# Patient Record
Sex: Female | Born: 1965
Health system: Southern US, Community
[De-identification: ages and names within clinical notes are randomized; demographics above are authoritative.]

## PROBLEM LIST (undated history)

## (undated) DIAGNOSIS — F329 Major depressive disorder, single episode, unspecified: Secondary | ICD-10-CM

## (undated) DIAGNOSIS — F419 Anxiety disorder, unspecified: Secondary | ICD-10-CM

## (undated) DIAGNOSIS — R55 Syncope and collapse: Secondary | ICD-10-CM

## (undated) DIAGNOSIS — M810 Age-related osteoporosis without current pathological fracture: Secondary | ICD-10-CM

## (undated) DIAGNOSIS — R002 Palpitations: Secondary | ICD-10-CM

## (undated) DIAGNOSIS — Z803 Family history of malignant neoplasm of breast: Secondary | ICD-10-CM

## (undated) DIAGNOSIS — A64 Unspecified sexually transmitted disease: Secondary | ICD-10-CM

## (undated) HISTORY — DX: Unspecified sexually transmitted disease: A64

## (undated) HISTORY — DX: Anxiety disorder, unspecified: F41.9

## (undated) HISTORY — DX: Family history of malignant neoplasm of breast: Z80.3

## (undated) HISTORY — DX: Age-related osteoporosis without current pathological fracture: M81.0

## (undated) HISTORY — PX: BREAST REDUCTION SURGERY: SHX8

## (undated) HISTORY — DX: Palpitations: R00.2

## (undated) HISTORY — DX: Syncope and collapse: R55

## (undated) HISTORY — DX: Major depressive disorder, single episode, unspecified: F32.9

---

## 1998-02-12 ENCOUNTER — Inpatient Hospital Stay (HOSPITAL_COMMUNITY): Admission: AD | Admit: 1998-02-12 | Discharge: 1998-02-14 | Payer: Self-pay | Admitting: Obstetrics and Gynecology

## 1998-04-06 ENCOUNTER — Other Ambulatory Visit: Admission: RE | Admit: 1998-04-06 | Discharge: 1998-04-06 | Payer: Self-pay | Admitting: Obstetrics and Gynecology

## 1999-05-03 ENCOUNTER — Other Ambulatory Visit: Admission: RE | Admit: 1999-05-03 | Discharge: 1999-05-03 | Payer: Self-pay | Admitting: Obstetrics and Gynecology

## 2000-08-13 ENCOUNTER — Other Ambulatory Visit: Admission: RE | Admit: 2000-08-13 | Discharge: 2000-08-13 | Payer: Self-pay | Admitting: Obstetrics and Gynecology

## 2001-05-21 ENCOUNTER — Encounter: Payer: Self-pay | Admitting: Obstetrics and Gynecology

## 2001-05-21 ENCOUNTER — Ambulatory Visit (HOSPITAL_COMMUNITY): Admission: RE | Admit: 2001-05-21 | Discharge: 2001-05-21 | Payer: Self-pay | Admitting: Obstetrics and Gynecology

## 2001-08-18 ENCOUNTER — Other Ambulatory Visit: Admission: RE | Admit: 2001-08-18 | Discharge: 2001-08-18 | Payer: Self-pay | Admitting: Obstetrics and Gynecology

## 2002-08-11 ENCOUNTER — Other Ambulatory Visit: Admission: RE | Admit: 2002-08-11 | Discharge: 2002-08-11 | Payer: Self-pay | Admitting: Obstetrics and Gynecology

## 2003-04-19 ENCOUNTER — Emergency Department (HOSPITAL_COMMUNITY): Admission: EM | Admit: 2003-04-19 | Discharge: 2003-04-20 | Payer: Self-pay | Admitting: Emergency Medicine

## 2003-08-15 ENCOUNTER — Other Ambulatory Visit: Admission: RE | Admit: 2003-08-15 | Discharge: 2003-08-15 | Payer: Self-pay | Admitting: Obstetrics and Gynecology

## 2004-11-14 DIAGNOSIS — F419 Anxiety disorder, unspecified: Secondary | ICD-10-CM

## 2004-11-14 DIAGNOSIS — F32A Depression, unspecified: Secondary | ICD-10-CM

## 2004-11-14 HISTORY — DX: Depression, unspecified: F32.A

## 2004-11-14 HISTORY — DX: Anxiety disorder, unspecified: F41.9

## 2004-11-19 ENCOUNTER — Other Ambulatory Visit: Admission: RE | Admit: 2004-11-19 | Discharge: 2004-11-19 | Payer: Self-pay | Admitting: Obstetrics and Gynecology

## 2004-11-19 ENCOUNTER — Encounter: Admission: RE | Admit: 2004-11-19 | Discharge: 2004-11-19 | Payer: Self-pay | Admitting: Obstetrics and Gynecology

## 2005-09-04 ENCOUNTER — Other Ambulatory Visit: Admission: RE | Admit: 2005-09-04 | Discharge: 2005-09-04 | Payer: Self-pay | Admitting: Obstetrics and Gynecology

## 2005-11-14 HISTORY — PX: AUGMENTATION MAMMAPLASTY: SUR837

## 2005-11-14 HISTORY — PX: BREAST ENHANCEMENT SURGERY: SHX7

## 2005-11-29 ENCOUNTER — Other Ambulatory Visit: Admission: RE | Admit: 2005-11-29 | Discharge: 2005-11-29 | Payer: Self-pay | Admitting: Obstetrics and Gynecology

## 2006-01-31 ENCOUNTER — Encounter: Admission: RE | Admit: 2006-01-31 | Discharge: 2006-01-31 | Payer: Self-pay | Admitting: Obstetrics and Gynecology

## 2007-02-19 ENCOUNTER — Encounter: Admission: RE | Admit: 2007-02-19 | Discharge: 2007-02-19 | Payer: Self-pay | Admitting: Obstetrics and Gynecology

## 2007-02-24 ENCOUNTER — Other Ambulatory Visit: Admission: RE | Admit: 2007-02-24 | Discharge: 2007-02-24 | Payer: Self-pay | Admitting: Obstetrics and Gynecology

## 2008-03-11 ENCOUNTER — Other Ambulatory Visit: Admission: RE | Admit: 2008-03-11 | Discharge: 2008-03-11 | Payer: Self-pay | Admitting: Obstetrics and Gynecology

## 2008-06-28 ENCOUNTER — Encounter: Admission: RE | Admit: 2008-06-28 | Discharge: 2008-06-28 | Payer: Self-pay | Admitting: Obstetrics and Gynecology

## 2009-01-30 ENCOUNTER — Encounter: Payer: Self-pay | Admitting: Cardiology

## 2009-11-29 ENCOUNTER — Encounter: Admission: RE | Admit: 2009-11-29 | Discharge: 2009-11-29 | Payer: Self-pay | Admitting: Obstetrics and Gynecology

## 2010-12-14 ENCOUNTER — Other Ambulatory Visit: Payer: Self-pay | Admitting: Obstetrics and Gynecology

## 2010-12-14 DIAGNOSIS — Z1231 Encounter for screening mammogram for malignant neoplasm of breast: Secondary | ICD-10-CM

## 2011-01-10 ENCOUNTER — Ambulatory Visit: Payer: Self-pay

## 2011-02-08 ENCOUNTER — Ambulatory Visit
Admission: RE | Admit: 2011-02-08 | Discharge: 2011-02-08 | Disposition: A | Discharge status: Home / Self Care | Payer: BC Managed Care – PPO | Source: Ambulatory Visit | Attending: Obstetrics and Gynecology | Admitting: Obstetrics and Gynecology

## 2011-02-08 DIAGNOSIS — Z1231 Encounter for screening mammogram for malignant neoplasm of breast: Secondary | ICD-10-CM

## 2011-05-29 ENCOUNTER — Encounter: Payer: Self-pay | Admitting: *Deleted

## 2011-05-29 DIAGNOSIS — R002 Palpitations: Secondary | ICD-10-CM | POA: Insufficient documentation

## 2011-05-29 DIAGNOSIS — H538 Other visual disturbances: Secondary | ICD-10-CM | POA: Insufficient documentation

## 2011-05-29 DIAGNOSIS — R55 Syncope and collapse: Secondary | ICD-10-CM

## 2011-09-27 ENCOUNTER — Encounter: Payer: Self-pay | Admitting: Cardiovascular Disease

## 2012-03-25 ENCOUNTER — Encounter: Payer: Self-pay | Admitting: Obstetrics and Gynecology

## 2012-03-26 ENCOUNTER — Encounter: Payer: Self-pay | Admitting: Obstetrics and Gynecology

## 2012-03-31 ENCOUNTER — Ambulatory Visit: Payer: Self-pay | Admitting: Obstetrics and Gynecology

## 2012-04-01 ENCOUNTER — Ambulatory Visit (INDEPENDENT_AMBULATORY_CARE_PROVIDER_SITE_OTHER): Payer: BC Managed Care – PPO | Admitting: Obstetrics and Gynecology

## 2012-04-01 ENCOUNTER — Encounter: Payer: Self-pay | Admitting: Obstetrics and Gynecology

## 2012-04-01 ENCOUNTER — Other Ambulatory Visit: Payer: Self-pay | Admitting: Obstetrics and Gynecology

## 2012-04-01 ENCOUNTER — Other Ambulatory Visit: Payer: Self-pay

## 2012-04-01 VITALS — BP 98/60 | Ht 65.0 in | Wt 151.0 lb

## 2012-04-01 DIAGNOSIS — Z01419 Encounter for gynecological examination (general) (routine) without abnormal findings: Secondary | ICD-10-CM

## 2012-04-01 DIAGNOSIS — Z1231 Encounter for screening mammogram for malignant neoplasm of breast: Secondary | ICD-10-CM

## 2012-04-01 DIAGNOSIS — Z9882 Breast implant status: Secondary | ICD-10-CM

## 2012-04-01 DIAGNOSIS — Z0000 Encounter for general adult medical examination without abnormal findings: Secondary | ICD-10-CM

## 2012-04-01 DIAGNOSIS — B009 Herpesviral infection, unspecified: Secondary | ICD-10-CM

## 2012-04-01 LAB — POCT URINALYSIS DIPSTICK
Blood, UA: NEGATIVE
Ketones, UA: NEGATIVE
Leukocytes, UA: NEGATIVE
Protein, UA: NEGATIVE
pH, UA: 7

## 2012-04-01 MED ORDER — VALACYCLOVIR HCL 500 MG PO TABS: 500.0000 mg | ORAL_TABLET | Freq: Two times a day (BID) | ORAL | Status: DC

## 2012-04-01 NOTE — Patient Instructions (Signed)
We recommended that you start or continue a regular exercise program for good health. Regular exercise means any activity that makes your heart beat faster and makes you sweat.  We recommend exercising at least 30 minutes per day at least 3 days a week, preferably 4 or 5.  We also recommend a diet low in fat and sugar.  Inactivity, poor dietary choices and obesity can cause diabetes, heart attack, stroke, and kidney damage, among others.    All women over 40 years old should have a yearly mammogram. Many facilities now offer a "3D" mammogram, which may cost around $50 extra out of pocket. If possible,  we recommend you accept the option to have the 3D mammogram performed.  It both reduces the number of women who will be called back for extra views which then turn out to be normal, and it is better than the routine mammogram at detecting truly abnormal areas.    Women should limit their alcohol intake to no more than 7 drinks/beers/glasses of wine (combined, not each!) per week. Moderation of alcohol intake to this level decreases your risk of breast cancer and liver damage. And of course, no recreational drugs are part of a healthy lifestyle.  And absolutely no smoking or even second hand smoke. Most people know smoking can cause heart and lung diseases, but did you know it also contributes to weakening of your bones? Aging of your skin?  Yellowing of your teeth and nails?  Pap smears, to check for cervical cancer or precancers,  have traditionally been done yearly, although recent scientific advances have shown that most women can have pap smears less often.  However, every woman still should have a physical exam from her gynecologist every year. It will include a breast check, inspection of the vulva and vagina to check for abnormal growths or skin changes, a visual exam of the cervix, and then an exam to evaluate the size and shape of the uterus and ovaries.  And after 49 years of age, a rectal exam is  indicated to check for rectal cancers. We will also provide age appropriate advice regarding health maintenance, like when you should have certain vaccines, screening for sexually transmitted diseases, bone density testing, colonoscopy, mammograms, etc.   Adequate intake of calcium and Vitamin D are recommended.  The recommendations for exact amounts of these supplements seem to change often, but generally speaking 600 mg of calcium (either carbonate or citrate) and 800 units of Vitamin D per day seems prudent. Certain women may benefit from higher intake of Vitamin D.  If you are among these women, your doctor will have told you during your visit.     

## 2012-04-01 NOTE — Progress Notes (Signed)
47 y.o. MarriedCaucasian female   G5P3 here for annual exam.  Moved into a new house 3 weeks ago.  Pristiq made her feel bad if she forgot it or took it late, so she weaned off it and is doing ok.  [facility] wellnes had her start on armour thyroid last year and a TSH check here was normal, and I instructed her to stop it, so we will re check it today.    No LMP recorded. Patient is not currently having periods (Reason: IUD).          Sexually active: yes  The current method of family planning is IUD.    Exercising: yes  Gym/ health club routine includes cardio. Last mammogram:  1/25/20213   Smoking:no Alcohol:2-3 times a week(wine) Last colonoscopy:none Last Bone Density:  none  Hgb:    14.0            Urine:neg   reports that she quit smoking about 27 years ago. She has never used smokeless tobacco. She reports that she drinks about 1.5 ounces of alcohol per week. She reports that she does not use illicit drugs.  Health Maintenance  Topic Date Due  . Influenza Vaccine  09/14/1964  . Pap Smear  11/14/1981  . Tetanus/tdap  11/15/1982    Family History  Problem Relation Age of Onset  . Coronary artery disease Neg Hx   . Breast cancer Mother 8  . Polycythemia Mother   . Heart attack Mother   . Heart attack Father   . Parkinson's disease Father     Patient Active Problem List  Diagnosis  . Syncope  . Palpitations  . Blurred vision    Past Medical History  Diagnosis Date  . Palpitations   . Syncope and collapse   . Anxiety 11/2004  . Depression 11/2004  . HSV (herpes simplex virus) infection     recurrent HSV lower back    Past Surgical History  Procedure Laterality Date  . Breast reduction surgery    . Breast enhancement surgery  11/2005    saline sub pectorial  . Cesarean section  08/1990    Allergies: Review of patient's allergies indicates no known allergies.  Current Outpatient Prescriptions  Medication Sig Dispense Refill  . levonorgestrel (MIRENA) 20  MCG/24HR IUD 1 each by Intrauterine route once.      . multivitamin (THERAGRAN) per tablet Take 1 tablet by mouth daily.       No current facility-administered medications for this visit.    ROS: Pertinent items are noted in HPI.  Exam:    BP 98/60  Ht 5\' 5"  (1.651 m)  Wt 151 lb (68.493 kg)  BMI 25.13 kg/m2   Wt Readings from Last 3 Encounters:  04/01/12 151 lb (68.493 kg)     Ht Readings from Last 3 Encounters:  04/01/12 5\' 5"  (1.651 m)    General appearance: alert, cooperative and appears stated age Head: Normocephalic, without obvious abnormality, atraumatic Neck: no adenopathy, supple, symmetrical, trachea midline and thyroid not enlarged, symmetric, no tenderness/mass/nodules Lungs: clear to auscultation bilaterally Breasts: Inspection negative, No nipple retraction or dimpling, No nipple discharge or bleeding, No axillary or supraclavicular adenopathy, Normal to palpation without dominant masses Heart: regular rate and rhythm Abdomen: soft, non-tender; bowel sounds normal; no masses,  no organomegaly Extremities: extremities normal, atraumatic, no cyanosis or edema Skin: Skin color, texture, turgor normal. No rashes or lesions Lymph nodes: Cervical, supraclavicular, and axillary nodes normal. No abnormal inguinal nodes palpated Neurologic:  Grossly normal   Pelvic: External genitalia:  no lesions              Urethra:  normal appearing urethra with no masses, tenderness or lesions              Bartholins and Skenes: normal                 Vagina: normal appearing vagina with normal color and discharge, no lesions              Cervix: normal appearance IUD strings vizd              Pap taken: yes        Bimanual Exam:  Uterus:  uterus is normal size, shape, consistency and nontender                                      Adnexa: normal adnexa in size, nontender and no masses                                      Rectovaginal: Confirms                                       Anus:  normal sphincter tone, no lesions  A: well woman     P: mammogram pap smear return annually or prn     An After Visit Summary was printed and given to the patient.

## 2012-04-01 NOTE — Addendum Note (Signed)
Addended by: Alison Murray on: 04/01/2012 05:16 PM   Modules accepted: Orders

## 2012-04-17 ENCOUNTER — Encounter: Payer: Self-pay | Admitting: Obstetrics and Gynecology

## 2012-05-06 ENCOUNTER — Ambulatory Visit
Admission: RE | Admit: 2012-05-06 | Discharge: 2012-05-06 | Disposition: A | Discharge status: Home / Self Care | Payer: BC Managed Care – PPO | Source: Ambulatory Visit

## 2012-05-06 DIAGNOSIS — Z1231 Encounter for screening mammogram for malignant neoplasm of breast: Secondary | ICD-10-CM

## 2012-05-06 DIAGNOSIS — Z9882 Breast implant status: Secondary | ICD-10-CM

## 2012-06-11 ENCOUNTER — Encounter: Payer: Self-pay | Admitting: Obstetrics and Gynecology

## 2012-06-15 ENCOUNTER — Ambulatory Visit: Payer: Self-pay | Admitting: Obstetrics and Gynecology

## 2012-10-14 ENCOUNTER — Telehealth: Payer: Self-pay | Admitting: Obstetrics and Gynecology

## 2012-10-15 ENCOUNTER — Ambulatory Visit (INDEPENDENT_AMBULATORY_CARE_PROVIDER_SITE_OTHER): Payer: BC Managed Care – PPO | Admitting: Obstetrics and Gynecology

## 2012-10-15 ENCOUNTER — Telehealth: Payer: Self-pay | Admitting: Obstetrics and Gynecology

## 2012-10-15 ENCOUNTER — Encounter: Payer: Self-pay | Admitting: Obstetrics and Gynecology

## 2012-10-15 VITALS — BP 100/64 | HR 68 | Ht 65.0 in | Wt 152.5 lb

## 2012-10-15 DIAGNOSIS — F411 Generalized anxiety disorder: Secondary | ICD-10-CM

## 2012-10-15 MED ORDER — FLUOXETINE HCL 10 MG PO TABS: 10.0000 mg | ORAL_TABLET | Freq: Every day | ORAL | Status: DC

## 2012-10-15 MED ORDER — ALPRAZOLAM 0.25 MG PO TABS: 0.2500 mg | ORAL_TABLET | Freq: Three times a day (TID) | ORAL | Status: DC | PRN

## 2012-10-15 NOTE — Telephone Encounter (Addendum)
Pharmacist has a question regarding the quantity of a prescription sent today.  The pharmacist also says the patient normally uses [facility] @[address]  [phone].

## 2012-10-15 NOTE — Progress Notes (Signed)
Patient ID: Stefanie Rodriguez, female   DOB: Jul 18, 1965, 47 y.o.   MRN: [id number] GYNECOLOGY PROBLEM VISIT  PCP: Blair Heys, MD  Referring provider:   HPI: 47 y.o.   Married  Caucasian  female   G5P3 with No LMP recorded. Patient is not currently having periods (Reason: IUD).   here for  Consult.  Anxious.  Was on Celexa, Pristiq, Zoloft, Paxil.  Off all of them.  Felt terrible when went off of them.  Wants to try Prozac.  Weight gain.  Some night sweats.   Some hot flashes.   Decreased sex drive. Vaginal dryness.   Family history of breast cancer - mother at age late 23s and paternal grandfather at age 98.    Patient and husband are flying on separate flights to the [place] due to anxiety about the children.    GYNECOLOGIC HISTORY: No LMP recorded. Patient is not currently having periods (Reason: IUD). Sexually active:  yes Partner preference: female Contraception:  Mirena IUD: inserted 08/2008.  Menopausal hormone therapy: no DES exposure:  no  Blood transfusions:   no Sexually transmitted diseases:   no GYN Procedures:  no Mammogram:  04-01-12 wnl:[facility]               Pap:   04-01-12 wnl History of abnormal pap smear:  no   OB History   Grav Para Term Preterm Abortions TAB SAB Ect Mult Living   5 3        3          Family History  Problem Relation Age of Onset  . Coronary artery disease Neg Hx   . Breast cancer Mother 59  . Polycythemia Mother   . Heart attack Mother   . Heart attack Father   . Parkinson's disease Father     Patient Active Problem List   Diagnosis Date Noted  . Syncope 05/29/2011  . Palpitations 05/29/2011  . Blurred vision 05/29/2011    Past Medical History  Diagnosis Date  . Palpitations   . Syncope and collapse   . Anxiety 11/2004  . Depression 11/2004  . STD (sexually transmitted disease)     Recurrent HSV Lower Back    Past Surgical History  Procedure Laterality Date  . Breast reduction surgery    . Breast  enhancement surgery  11/2005    saline sub pectorial  . Cesarean section  08/1990  . Augmentation mammaplasty  11/07    Saline    ALLERGIES: Review of patient's allergies indicates no known allergies.  Current Outpatient Prescriptions  Medication Sig Dispense Refill  . calcium carbonate (OS-CAL) 600 MG TABS tablet Take 600 mg by mouth 2 (two) times daily with a meal.      . levonorgestrel (MIRENA) 20 MCG/24HR IUD 1 each by Intrauterine route once.      . multivitamin (THERAGRAN) per tablet Take 1 tablet by mouth daily.      . valACYclovir (VALTREX) 500 MG tablet Take 1 tablet (500 mg total) by mouth 2 (two) times daily. Take one tablet BID at onset of symptoms for 3 days.  12 tablet  1   No current facility-administered medications for this visit.     ROS:  Pertinent items are noted in HPI.  SOCIAL HISTORY:    Married.  Three children.   PHYSICAL EXAMINATION:    BP 100/64  Pulse 68  Ht 5\' 5"  (1.651 m)  Wt 152 lb 8 oz (69.174 kg)  BMI 25.38 kg/m2  Wt Readings from Last 3 Encounters:  10/15/12 152 lb 8 oz (69.174 kg)  04/01/12 151 lb (68.493 kg)     Ht Readings from Last 3 Encounters:  10/15/12 5\' 5"  (1.651 m)  04/01/12 5\' 5"  (1.651 m)    General appearance: alert, cooperative and appears stated age  ASSESSMENT  Perimenopausal female. Mirena IUD user. Chronic anxiety. Family history of breast cancer.   PLAN  Start Prozac 10 mg daily.  Medication discussed. See Epic. Limited Rx for Xanax 0.25 mg po tid prn.  Medication discussed.  See Epic.  Referred to Berniece Andreas for counseling.  Patient will call to make an appointment. Will not do estrogen therapy at this time.  Recheck in 2 months.  Annual exam in Spring 2015.  An After Visit Summary was printed and given to the patient.

## 2012-10-15 NOTE — Telephone Encounter (Signed)
Scheduled consult with Dr. Edward Jolly.

## 2012-10-15 NOTE — Patient Instructions (Signed)
Fluoxetine capsules or tablets (Depression/Mood Disorders) What is this medicine? FLUOXETINE (floo OX e teen) belongs to a class of drugs known as selective serotonin reuptake inhibitors (SSRIs). It helps to treat mood problems such as depression, obsessive compulsive disorder, and panic attacks. It can also treat certain eating disorders. This medicine may be used for other purposes; ask your health care provider or pharmacist if you have questions. What should I tell my health care provider before I take this medicine? They need to know if you have any of these conditions: -bipolar disorder or mania -diabetes -glaucoma -liver disease -psychosis -seizures -suicidal thoughts or history of attempted suicide -an unusual or allergic reaction to fluoxetine, other medicines, foods, dyes, or preservatives -pregnant or trying to get pregnant -breast-feeding How should I use this medicine? Take this medicine by mouth with a glass of water. Follow the directions on the prescription label. You can take this medicine with or without food. Take your medicine at regular intervals. Do not take it more often than directed. Do not stop taking this medicine suddenly except upon the advice of your doctor. Stopping this medicine too quickly may cause serious side effects or your condition may worsen. A special MedGuide will be given to you by the pharmacist with each prescription and refill. Be sure to read this information carefully each time. Talk to your pediatrician regarding the use of this medicine in children. While this drug may be prescribed for children as young as 7 years for selected conditions, precautions do apply. Overdosage: If you think you have taken too much of this medicine contact a poison control center or emergency room at once. NOTE: This medicine is only for you. Do not share this medicine with others. What if I miss a dose? If you miss a dose, skip the missed dose and go back to your  regular dosing schedule. Do not take double or extra doses. What may interact with this medicine? Do not take fluoxetine with any of the following medications: -other medicines containing fluoxetine, like Sarafem or Symbyax -cisapride -linezolid -MAOIs like Carbex, Eldepryl, Marplan, Nardil, and Parnate -methylene blue (injected into a vein) -pimozide -thioridazine This medicine may also interact with the following medications: -alcohol -aspirin and aspirin-like medicines -carbamazepine -certain medicines for depression, anxiety, or psychotic disturbances -certain medicines for migraine headaches like almotriptan, eletriptan, frovatriptan, naratriptan, rizatriptan, sumatriptan, zolmitriptan -digoxin -diuretics -fentanyl -flecainide -furazolidone -isoniazid -lithium -medicines for sleep -medicines that treat or prevent blood clots like warfarin, enoxaparin, and dalteparin -NSAIDs, medicines for pain and inflammation, like ibuprofen or naproxen -phenytoin -procarbazine -propafenone -rasagiline -ritonavir -supplements like St. John's wort, kava kava, valerian -tramadol -tryptophan -vinblastine This list may not describe all possible interactions. Give your health care provider a list of all the medicines, herbs, non-prescription drugs, or dietary supplements you use. Also tell them if you smoke, drink alcohol, or use illegal drugs. Some items may interact with your medicine. What should I watch for while using this medicine? Tell your doctor if your symptoms do not get better or if they get worse. Visit your doctor or health care professional for regular checks on your progress. Because it may take several weeks to see the full effects of this medicine, it is important to continue your treatment as prescribed by your doctor. Patients and their families should watch out for new or worsening thoughts of suicide or depression. Also watch out for sudden changes in feelings such as  feeling anxious, agitated, panicky, irritable, hostile, aggressive, impulsive, severely restless,   overly excited and hyperactive, or not being able to sleep. If this happens, especially at the beginning of treatment or after a change in dose, call your health care professional. Bonita Quin may get drowsy or dizzy. Do not drive, use machinery, or do anything that needs mental alertness until you know how this medicine affects you. Do not stand or sit up quickly, especially if you are an older patient. This reduces the risk of dizzy or fainting spells. Alcohol may interfere with the effect of this medicine. Avoid alcoholic drinks. Your mouth may get dry. Chewing sugarless gum or sucking hard candy, and drinking plenty of water may help. Contact your doctor if the problem does not go away or is severe. This medicine may affect blood sugar levels. If you have diabetes, check with your doctor or health care professional before you change your diet or the dose of your diabetic medicine. What side effects may I notice from receiving this medicine? Side effects that you should report to your doctor or health care professional as soon as possible: -allergic reactions like skin rash, itching or hives, swelling of the face, lips, or tongue -breathing problems -confusion -fast or irregular heart rate, palpitations -flu-like fever, chills, cough, muscle or joint aches and pains -seizures -suicidal thoughts or other mood changes -tremors -trouble sleeping -unusual bleeding or bruising -unusually tired or weak -vomiting Side effects that usually do not require medical attention (report to your doctor or health care professional if they continue or are bothersome): -blurred vision -change in sex drive or performance -diarrhea -dry mouth -flushing -headache -increased or decreased appetite -nausea -sweating This list may not describe all possible side effects. Call your doctor for medical advice about side  effects. You may report side effects to FDA at 1-800-FDA-1088. Where should I keep my medicine? Keep out of the reach of children. Store at room temperature between 15 and 30 degrees C (59 and 86 degrees F). Throw away any unused medicine after the expiration date. NOTE: This sheet is a summary. It may not cover all possible information. If you have questions about this medicine, talk to your doctor, pharmacist, or health care provider.  2013, Elsevier/Gold Standard. (05/17/2011 7:31:49 PM) Alprazolam tablets What is this medicine? ALPRAZOLAM (al PRAY zoe lam) is a benzodiazepine. It is used to treat anxiety and panic attacks. This medicine may be used for other purposes; ask your health care provider or pharmacist if you have questions. What should I tell my health care provider before I take this medicine? They need to know if you have any of these conditions: -an alcohol or drug abuse problem -bipolar disorder, depression, psychosis or other mental health conditions -glaucoma -kidney or liver disease -lung or breathing disease -myasthenia gravis -Parkinson's disease -porphyria -seizures or a history of seizures -suicidal thoughts -an unusual or allergic reaction to alprazolam, other benzodiazepines, foods, dyes, or preservatives -pregnant or trying to get pregnant -breast-feeding How should I use this medicine? Take this medicine by mouth with a glass of water. Follow the directions on the prescription label. Take your medicine at regular intervals. Do not take it more often than directed. If you have been taking this medicine regularly for some time, do not suddenly stop taking it. You must gradually reduce the dose or you may get severe side effects. Ask your doctor or health care professional for advice. Even after you stop taking this medicine it can still affect your body for several days. Talk to your pediatrician regarding the use of  this medicine in children. Special care may be  needed. Overdosage: If you think you have taken too much of this medicine contact a poison control center or emergency room at once. NOTE: This medicine is only for you. Do not share this medicine with others. What if I miss a dose? If you miss a dose, take it as soon as you can. If it is almost time for your next dose, take only that dose. Do not take double or extra doses. What may interact with this medicine? Do not take this medicine with any of the following medications: -certain medicines for HIV infection or AIDS -ketoconazole -itraconazole This medicine may also interact with the following medications: -birth control pills -certain macrolide antibiotics like clarithromycin, erythromycin, troleandomycin -cimetidine -cyclosporine -ergotamine -grapefruit juice -herbal or dietary supplements like kava kava, melatonin, dehydroepiandrosterone, DHEA, St. John's Wort or valerian -imatinib, STI-571 -isoniazid -levodopa -medicines for depression, anxiety, or psychotic disturbances -prescription pain medicines -rifampin, rifapentine, or rifabutin -some medicines for blood pressure or heart problems -some medicines for seizures like carbamazepine, oxcarbazepine, phenobarbital, phenytoin, primidone This list may not describe all possible interactions. Give your health care provider a list of all the medicines, herbs, non-prescription drugs, or dietary supplements you use. Also tell them if you smoke, drink alcohol, or use illegal drugs. Some items may interact with your medicine. What should I watch for while using this medicine? Visit your doctor or health care professional for regular checks on your progress. Your body can become dependent on this medicine. Ask your doctor or health care professional if you still need to take it. You may get drowsy or dizzy. Do not drive, use machinery, or do anything that needs mental alertness until you know how this medicine affects you. To reduce the  risk of dizzy and fainting spells, do not stand or sit up quickly, especially if you are an older patient. Alcohol may increase dizziness and drowsiness. Avoid alcoholic drinks. Do not treat yourself for coughs, colds or allergies without asking your doctor or health care professional for advice. Some ingredients can increase possible side effects. What side effects may I notice from receiving this medicine? Side effects that you should report to your doctor or health care professional as soon as possible: -allergic reactions like skin rash, itching or hives, swelling of the face, lips, or tongue -confusion, forgetfulness -depression -difficulty sleeping -difficulty speaking -feeling faint or lightheaded, falls -mood changes, excitability or aggressive behavior -muscle cramps -trouble passing urine or change in the amount of urine -unusually weak or tired Side effects that usually do not require medical attention (report to your doctor or health care professional if they continue or are bothersome): -change in sex drive or performance -changes in appetite This list may not describe all possible side effects. Call your doctor for medical advice about side effects. You may report side effects to FDA at 1-800-FDA-1088. Where should I keep my medicine? Keep out of the reach of children. This medicine can be abused. Keep your medicine in a safe place to protect it from theft. Do not share this medicine with anyone. Selling or giving away this medicine is dangerous and against the law. Store at room temperature between 20 and 25 degrees C (68 and 77 degrees F). Throw away any unused medicine after the expiration date. NOTE: This sheet is a summary. It may not cover all possible information. If you have questions about this medicine, talk to your doctor, pharmacist, or health care provider.  2013, Elsevier/Gold  Standard. (03/26/2007 10:34:46 AM)

## 2012-10-15 NOTE — Telephone Encounter (Signed)
Pharmacy called to clarify Prozac Rx sent as #3 with 1refill, I corrected Rx to say #30 with 1 refill po q day cm

## 2012-10-16 DIAGNOSIS — F411 Generalized anxiety disorder: Secondary | ICD-10-CM | POA: Insufficient documentation

## 2012-12-15 ENCOUNTER — Other Ambulatory Visit: Payer: Self-pay | Admitting: Obstetrics and Gynecology

## 2012-12-15 ENCOUNTER — Other Ambulatory Visit: Payer: Self-pay | Admitting: Orthopedic Surgery

## 2012-12-15 ENCOUNTER — Telehealth: Payer: Self-pay | Admitting: Obstetrics and Gynecology

## 2012-12-15 MED ORDER — FLUOXETINE HCL 10 MG PO TABS: 10.0000 mg | ORAL_TABLET | Freq: Every day | ORAL | Status: DC

## 2012-12-15 NOTE — Telephone Encounter (Signed)
Pt wants to talk with the nurse concerning her medication. Pt is requesting one month of medication until her appt in Jan 2015

## 2012-12-15 NOTE — Telephone Encounter (Signed)
Please let the patient know that I will refill her Rx and see her back in January!  Thanks.

## 2012-12-15 NOTE — Telephone Encounter (Signed)
Spoke with pt who has been taking Prozac 10 mg for a couple of months with good results and no apparent side effects. Pt had to cancel her follow up appt with BS tomorrow and reschedule for next month, and she took her last Prozac today. Pt wondering if she could get a month's supply to get her through to her appt?

## 2012-12-16 ENCOUNTER — Ambulatory Visit: Payer: BC Managed Care – PPO | Admitting: Obstetrics and Gynecology

## 2012-12-17 MED ORDER — FLUOXETINE HCL 10 MG PO TABS: 10.0000 mg | ORAL_TABLET | Freq: Every day | ORAL | Status: DC

## 2012-12-17 NOTE — Telephone Encounter (Signed)
Patient went to pick up medication but they said they do not have it. im showing we called it in 12/15/12 can you check this FLUoxetine (PROZAC) 10 MG tablet  Take 1 tablet (10 mg total) by mouth daily., Starting 12/15/2012, Until Discontinued, No Print, Last Dose: Not Recorded  Refills: 1 ordered Pharmacy: [id number] - [place], [place] - [address]

## 2012-12-17 NOTE — Telephone Encounter (Signed)
Patient is at pharmacy to pick up rx and it is not there. Appears that Rx was printed by Dr. Edward Jolly. [facility] on phone states that patient is there and wants to pick up rx. Rx given. Prozac 10 mg daily #30 with one refill. Repeated x 2.

## 2013-01-15 ENCOUNTER — Ambulatory Visit: Payer: BC Managed Care – PPO | Admitting: Obstetrics and Gynecology

## 2013-01-15 ENCOUNTER — Telehealth: Payer: Self-pay | Admitting: Obstetrics and Gynecology

## 2013-01-15 MED ORDER — FLUOXETINE HCL 10 MG PO TABS: 10.0000 mg | ORAL_TABLET | Freq: Every day | ORAL | Status: DC

## 2013-01-15 NOTE — Telephone Encounter (Signed)
Return call to patient, number confirmation.  LMTCB.  This appointment is for 2 month recheck and this is second time patient has canceled this appointment.

## 2013-01-15 NOTE — Telephone Encounter (Signed)
Pt cancelled her appt for follow up today because she is stuck in [place]. Would like nurse to call and reschedule her appointment.

## 2013-01-15 NOTE — Telephone Encounter (Signed)
Patient choice to reschedule. She returned to Prozac as medication to treat anxiety. If she is having problems on her current medication, she will need an appointment. If she is doing well, does not have to reschedule.

## 2013-01-15 NOTE — Telephone Encounter (Signed)
Patient calls, trveling in [place] with family and decide to extend stay.  Advised there is a cancellation charge for missed appointment but Dr Quincy Simmonds is ok for patient not to reschedule if she feels like symptoms are controlled with current dose.  Patient states she is doing well and requests refill on Prozac 10 mg, only has one pill left. Returning home today so request RX to [facility] at [address]. Denies need for further follow-up. Refill sent.   Routing to provider for final review. Patient agreeable to disposition. Will close encounter

## 2013-04-02 ENCOUNTER — Ambulatory Visit (INDEPENDENT_AMBULATORY_CARE_PROVIDER_SITE_OTHER): Payer: BC Managed Care – PPO | Admitting: Obstetrics and Gynecology

## 2013-04-02 ENCOUNTER — Encounter: Payer: Self-pay | Admitting: Obstetrics and Gynecology

## 2013-04-02 ENCOUNTER — Ambulatory Visit: Payer: BC Managed Care – PPO | Admitting: Obstetrics and Gynecology

## 2013-04-02 VITALS — BP 100/64 | HR 68 | Resp 16 | Ht 65.0 in | Wt 156.5 lb

## 2013-04-02 DIAGNOSIS — R635 Abnormal weight gain: Secondary | ICD-10-CM

## 2013-04-02 DIAGNOSIS — Z01419 Encounter for gynecological examination (general) (routine) without abnormal findings: Secondary | ICD-10-CM

## 2013-04-02 DIAGNOSIS — Z0000 Encounter for general adult medical examination without abnormal findings: Secondary | ICD-10-CM

## 2013-04-02 LAB — POCT URINALYSIS DIPSTICK
Bilirubin, UA: NEGATIVE
Glucose, UA: NEGATIVE
Ketones, UA: NEGATIVE
Leukocytes, UA: NEGATIVE
NITRITE UA: NEGATIVE
PROTEIN UA: NEGATIVE
RBC UA: NEGATIVE
UROBILINOGEN UA: NEGATIVE
pH, UA: 5

## 2013-04-02 LAB — CBC
HCT: 37.3 % (ref 36.0–46.0)
Hemoglobin: 13 g/dL (ref 12.0–15.0)
MCH: 32.6 pg (ref 26.0–34.0)
MCHC: 34.9 g/dL (ref 30.0–36.0)
MCV: 93.5 fL (ref 78.0–100.0)
PLATELETS: 305 10*3/uL (ref 150–400)
RBC: 3.99 MIL/uL (ref 3.87–5.11)
RDW: 13.7 % (ref 11.5–15.5)
WBC: 9.7 10*3/uL (ref 4.0–10.5)

## 2013-04-02 LAB — HEMOGLOBIN, FINGERSTICK: Hemoglobin, fingerstick: 13.3 g/dL (ref 12.0–16.0)

## 2013-04-02 MED ORDER — FLUOXETINE HCL 10 MG PO TABS: 10.0000 mg | ORAL_TABLET | Freq: Every day | ORAL | Status: DC

## 2013-04-02 NOTE — Patient Instructions (Signed)

## 2013-04-02 NOTE — Progress Notes (Signed)
Patient ID: Stefanie Rodriguez, female   DOB: 01/16/1965, 48 y.o.   MRN: [id number]  GYNECOLOGY VISIT  PCP:   Gaynelle Arabian, MD  Referring provider:   HPI: 48 y.o.   Married  Caucasian  female   G5P3 with No LMP recorded. Patient is not currently having periods (Reason: IUD).   here for  AEX.  Having hot flashes and night sweats. No cycles with Mirena IUD. Weight gain.  Wants thyroid check.  On Prozac for anxiety.  Feels balanced.  Wants to continue.   Hgb:    13.3 Urine:   Neg  GYNECOLOGIC HISTORY: No LMP recorded. Patient is not currently having periods (Reason: IUD). Sexually active:  yes Partner preference: female Contraception: Mirena IUD - placed 09/13/08. Menopausal hormone therapy: n/a DES exposure: no   Blood transfusions:   no Sexually transmitted diseases:  no GYN procedures and prior surgeries:  C-section, breast reduction and breast implants. Last mammogram: 05-06-12 [facility]                Last pap and high risk HPV testing:   04-01-12 wnl:neg HR HPV History of abnormal pap smear:  no   OB History   Grav Para Term Preterm Abortions TAB SAB Ect Mult Living   5 3        3        LIFESTYLE: Exercise:  cardio             Tobacco: no Alcohol:   3 glasses of wine per week Drug use:  no  OTHER HEALTH MAINTENANCE: Tetanus/TDap:  02/2008 Gardisil:              n/a Influenza:            2013 Zostavax:            n/a  Bone density:     n/a Colonoscopy:     n/a  Cholesterol check:   2014 wnl   Family History  Problem Relation Age of Onset  . Coronary artery disease Neg Hx   . Breast cancer Mother 92  . Polycythemia Mother   . Heart attack Mother   . Heart attack Father   . Parkinson's disease Father   . Hypertension Father   . Hyperlipidemia Father   . Breast cancer Paternal Grandmother     Patient Active Problem List   Diagnosis Date Noted  . Anxiety state, unspecified 10/16/2012  . Syncope 05/29/2011  . Palpitations 05/29/2011  .  Blurred vision 05/29/2011   Past Medical History  Diagnosis Date  . Palpitations   . Syncope and collapse   . Anxiety 11/2004  . Depression 11/2004  . STD (sexually transmitted disease)     Recurrent HSV Lower Back    Past Surgical History  Procedure Laterality Date  . Breast reduction surgery    . Breast enhancement surgery  11/2005    saline sub pectorial  . Cesarean section  08/1990  . Augmentation mammaplasty  11/07    Saline    ALLERGIES: Review of patient's allergies indicates no known allergies.  Current Outpatient Prescriptions  Medication Sig Dispense Refill  . calcium carbonate (OS-CAL) 600 MG TABS tablet Take 600 mg by mouth 2 (two) times daily with a meal.      . FLUoxetine (PROZAC) 10 MG tablet Take 1 tablet (10 mg total) by mouth daily.  30 tablet  2  . levonorgestrel (MIRENA) 20 MCG/24HR IUD 1 each by Intrauterine route once.      Marland Kitchen  multivitamin (THERAGRAN) per tablet Take 1 tablet by mouth daily.      . valACYclovir (VALTREX) 500 MG tablet Take 1 tablet (500 mg total) by mouth 2 (two) times daily. Take one tablet BID at onset of symptoms for 3 days.  12 tablet  1   No current facility-administered medications for this visit.     ROS:  Pertinent items are noted in HPI.  SOCIAL HISTORY:  At home.  Will go to [place] for brother's wedding in August.   PHYSICAL EXAMINATION:    BP 100/64  Pulse 68  Resp 16  Ht 5\' 5"  (1.651 m)  Wt 156 lb 8 oz (70.988 kg)  BMI 26.04 kg/m2   Wt Readings from Last 3 Encounters:  04/02/13 156 lb 8 oz (70.988 kg)  10/15/12 152 lb 8 oz (69.174 kg)  04/01/12 151 lb (68.493 kg)     Ht Readings from Last 3 Encounters:  04/02/13 5\' 5"  (1.651 m)  10/15/12 5\' 5"  (1.651 m)  04/01/12 5\' 5"  (1.651 m)    General appearance: alert, cooperative and appears stated age Head: Normocephalic, without obvious abnormality, atraumatic Neck: no adenopathy, supple, symmetrical, trachea midline and thyroid not enlarged, symmetric, no  tenderness/mass/nodules Lungs: clear to auscultation bilaterally Breasts: Consistent with reduction and implants.  No nipple retraction or dimpling, No nipple discharge or bleeding, No axillary or supraclavicular adenopathy, Normal to palpation without dominant masses Heart: regular rate and rhythm Abdomen: soft, non-tender; no masses,  no organomegaly Extremities: extremities normal, atraumatic, no cyanosis or edema Skin: Skin color, texture, turgor normal. No rashes or lesions Lymph nodes: Cervical, supraclavicular, and axillary nodes normal. No abnormal inguinal nodes palpated Neurologic: Grossly normal  Pelvic: External genitalia:  no lesions              Urethra:  normal appearing urethra with no masses, tenderness or lesions              Bartholins and Skenes: normal                 Vagina: normal appearing vagina with normal color and discharge, no lesions              Cervix: normal appearance.  IUD strings palpable.               Pap and high risk HPV testing done: no.            Bimanual Exam:  Uterus:  uterus is normal size, shape, consistency and nontender                                      Adnexa: normal adnexa in size, nontender and no masses                                      Rectovaginal: Confirms                                      Anus:  normal sphincter tone, no lesions  ASSESSMENT  Normal gynecologic exam. Perimenopausal symptoms.  Family history of breast cancer.  Anxiety controlled on Prozac. Mirena IUD patient.  Weight gain.   PLAN  Mammogram recommended yearly.   [facility].  Pap smear and high risk HPV testing Counseled on self breast exam, exercise. Lipid profile, CMC, CMP, TSH. Discussed colonoscopy at age 26.  Refill Prozac 10 mg daily.  See EPIC. Will remove Mirena IUD end of August/beginning of September.  Patient may want to see how she does without it and not plan to replace it automatically.  Return  annually or prn   An After Visit Summary was printed and given to the patient.

## 2013-04-03 LAB — LIPID PANEL
CHOL/HDL RATIO: 3.2 ratio
Cholesterol: 246 mg/dL — ABNORMAL HIGH (ref 0–200)
HDL: 76 mg/dL (ref >39)
LDL CALC: 133 mg/dL — AB (ref 0–99)
Triglycerides: 187 mg/dL — ABNORMAL HIGH (ref <150)
VLDL: 37 mg/dL (ref 0–40)

## 2013-04-03 LAB — COMPREHENSIVE METABOLIC PANEL
ALBUMIN: 4.3 g/dL (ref 3.5–5.2)
ALT: 22 U/L (ref 0–35)
AST: 24 U/L (ref 0–37)
Alkaline Phosphatase: 84 U/L (ref 39–117)
BUN: 18 mg/dL (ref 6–23)
CALCIUM: 9.1 mg/dL (ref 8.4–10.5)
CHLORIDE: 101 meq/L (ref 96–112)
CO2: 26 mEq/L (ref 19–32)
Creat: 0.65 mg/dL (ref 0.50–1.10)
Glucose, Bld: 82 mg/dL (ref 70–99)
POTASSIUM: 4.2 meq/L (ref 3.5–5.3)
SODIUM: 138 meq/L (ref 135–145)
TOTAL PROTEIN: 6.5 g/dL (ref 6.0–8.3)
Total Bilirubin: 0.4 mg/dL (ref 0.2–1.2)

## 2013-04-03 LAB — TSH: TSH: 0.86 u[IU]/mL (ref 0.350–4.500)

## 2013-06-24 ENCOUNTER — Other Ambulatory Visit: Payer: Self-pay

## 2013-06-24 DIAGNOSIS — Z1231 Encounter for screening mammogram for malignant neoplasm of breast: Secondary | ICD-10-CM

## 2013-08-13 ENCOUNTER — Ambulatory Visit
Admission: RE | Admit: 2013-08-13 | Discharge: 2013-08-13 | Disposition: A | Discharge status: Home / Self Care | Payer: BC Managed Care – PPO | Source: Ambulatory Visit

## 2013-08-13 DIAGNOSIS — Z1231 Encounter for screening mammogram for malignant neoplasm of breast: Secondary | ICD-10-CM

## 2013-11-15 ENCOUNTER — Encounter: Payer: Self-pay | Admitting: Obstetrics and Gynecology

## 2014-04-07 ENCOUNTER — Ambulatory Visit: Payer: BC Managed Care – PPO | Admitting: Obstetrics and Gynecology

## 2014-04-11 ENCOUNTER — Ambulatory Visit: Payer: BC Managed Care – PPO | Admitting: Obstetrics and Gynecology

## 2014-04-20 ENCOUNTER — Telehealth: Payer: Self-pay | Admitting: Obstetrics and Gynecology

## 2014-04-20 MED ORDER — ALPRAZOLAM 0.25 MG PO TABS: 0.2500 mg | ORAL_TABLET | Freq: Every evening | ORAL | Status: DC | PRN

## 2014-04-20 NOTE — Telephone Encounter (Signed)
Ok for Xanax 0.25 mg po tid prn.  #30, RF zero.

## 2014-04-20 NOTE — Telephone Encounter (Signed)
Spoke with patient. Patient would like to schedule IUD removal at this time. Mirena was due for removal in August of 2015. Removal scheduled for 4/27 at 3:15pm. Patient is agreeable to date and time. Patient also inquiring about medication for traveling. "I am flying to [place] to see my daughter who is studying abroad and I am terrified of flying. I was hoping she could prescribe me a small amount of xanax just to get through the trip. I have not used it since she last gave it to me and it really helped." Advised will speak with provider regarding rx and return call. Patient is agreeable.

## 2014-04-20 NOTE — Telephone Encounter (Signed)
Rx for Xanax 0.25mg  #30 0RF to your desk for signature before faxing.

## 2014-04-20 NOTE — Telephone Encounter (Signed)
Rx faxed to [facility] off [address] with cover sheet and confirmation. Patient notified.  Routing to provider for final review. Patient agreeable to disposition. Will close encounter

## 2014-04-20 NOTE — Telephone Encounter (Signed)
Patient calling stating she needs her IUD remove. She is not sure whether she wants to have a new one inserted.   She also wants to request something for her "nerves" for a trip she has coming up. Pharmacy on file is correct.

## 2014-05-06 ENCOUNTER — Ambulatory Visit (INDEPENDENT_AMBULATORY_CARE_PROVIDER_SITE_OTHER): Payer: BLUE CROSS/BLUE SHIELD | Admitting: Obstetrics and Gynecology

## 2014-05-06 ENCOUNTER — Encounter: Payer: Self-pay | Admitting: Obstetrics and Gynecology

## 2014-05-06 VITALS — BP 120/82 | HR 76 | Resp 22 | Ht 65.0 in | Wt 164.6 lb

## 2014-05-06 DIAGNOSIS — Z30432 Encounter for removal of intrauterine contraceptive device: Secondary | ICD-10-CM | POA: Diagnosis not present

## 2014-05-06 DIAGNOSIS — Z0000 Encounter for general adult medical examination without abnormal findings: Secondary | ICD-10-CM | POA: Diagnosis not present

## 2014-05-06 DIAGNOSIS — N951 Menopausal and female climacteric states: Secondary | ICD-10-CM

## 2014-05-06 DIAGNOSIS — Z01419 Encounter for gynecological examination (general) (routine) without abnormal findings: Secondary | ICD-10-CM | POA: Diagnosis not present

## 2014-05-06 DIAGNOSIS — Z803 Family history of malignant neoplasm of breast: Secondary | ICD-10-CM | POA: Diagnosis not present

## 2014-05-06 LAB — CBC
HCT: 38.7 % (ref 36.0–46.0)
Hemoglobin: 13.5 g/dL (ref 12.0–15.0)
MCH: 33.7 pg (ref 26.0–34.0)
MCHC: 34.9 g/dL (ref 30.0–36.0)
MCV: 96.5 fL (ref 78.0–100.0)
MPV: 11.4 fL (ref 8.6–12.4)
PLATELETS: 248 10*3/uL (ref 150–400)
RBC: 4.01 MIL/uL (ref 3.87–5.11)
RDW: 13.4 % (ref 11.5–15.5)
WBC: 7.8 10*3/uL (ref 4.0–10.5)

## 2014-05-06 LAB — POCT URINALYSIS DIPSTICK
Bilirubin, UA: NEGATIVE
Blood, UA: NEGATIVE
GLUCOSE UA: NEGATIVE
KETONES UA: NEGATIVE
LEUKOCYTES UA: NEGATIVE
NITRITE UA: NEGATIVE
Protein, UA: NEGATIVE
Urobilinogen, UA: NEGATIVE
pH, UA: 5

## 2014-05-06 LAB — COMPREHENSIVE METABOLIC PANEL
ALBUMIN: 3.9 g/dL (ref 3.5–5.2)
ALT: 24 U/L (ref 0–35)
AST: 24 U/L (ref 0–37)
Alkaline Phosphatase: 105 U/L (ref 39–117)
BUN: 16 mg/dL (ref 6–23)
CALCIUM: 8.9 mg/dL (ref 8.4–10.5)
CHLORIDE: 103 meq/L (ref 96–112)
CO2: 24 meq/L (ref 19–32)
CREATININE: 0.66 mg/dL (ref 0.50–1.10)
Glucose, Bld: 81 mg/dL (ref 70–99)
POTASSIUM: 4.2 meq/L (ref 3.5–5.3)
Sodium: 139 mEq/L (ref 135–145)
TOTAL PROTEIN: 6.3 g/dL (ref 6.0–8.3)
Total Bilirubin: 0.5 mg/dL (ref 0.2–1.2)

## 2014-05-06 LAB — LIPID PANEL
CHOL/HDL RATIO: 3.4 ratio
Cholesterol: 248 mg/dL — ABNORMAL HIGH (ref 0–200)
HDL: 72 mg/dL (ref >=46)
LDL CALC: 154 mg/dL — AB (ref 0–99)
Triglycerides: 109 mg/dL (ref <150)
VLDL: 22 mg/dL (ref 0–40)

## 2014-05-06 LAB — TSH: TSH: 1.063 u[IU]/mL (ref 0.350–4.500)

## 2014-05-06 NOTE — Patient Instructions (Signed)
EXERCISE AND DIET:  We recommended that you start or continue a regular exercise program for good health. Regular exercise means any activity that makes your heart beat faster and makes you sweat.  We recommend exercising at least 30 minutes per day at least 3 days a week, preferably 4 or 5.  We also recommend a diet low in fat and sugar.  Inactivity, poor dietary choices and obesity can cause diabetes, heart attack, stroke, and kidney damage, among others.    ALCOHOL AND SMOKING:  Women should limit their alcohol intake to no more than 7 drinks/beers/glasses of wine (combined, not each!) per week. Moderation of alcohol intake to this level decreases your risk of breast cancer and liver damage. And of course, no recreational drugs are part of a healthy lifestyle.  And absolutely no smoking or even second hand smoke. Most people know smoking can cause heart and lung diseases, but did you know it also contributes to weakening of your bones? Aging of your skin?  Yellowing of your teeth and nails?  CALCIUM AND VITAMIN D:  Adequate intake of calcium and Vitamin D are recommended.  The recommendations for exact amounts of these supplements seem to change often, but generally speaking 600 mg of calcium (either carbonate or citrate) and 800 units of Vitamin D per day seems prudent. Certain women may benefit from higher intake of Vitamin D.  If you are among these women, your doctor will have told you during your visit.    PAP SMEARS:  Pap smears, to check for cervical cancer or precancers,  have traditionally been done yearly, although recent scientific advances have shown that most women can have pap smears less often.  However, every woman still should have a physical exam from her gynecologist every year. It will include a breast check, inspection of the vulva and vagina to check for abnormal growths or skin changes, a visual exam of the cervix, and then an exam to evaluate the size and shape of the uterus and  ovaries.  And after 51 years of age, a rectal exam is indicated to check for rectal cancers. We will also provide age appropriate advice regarding health maintenance, like when you should have certain vaccines, screening for sexually transmitted diseases, bone density testing, colonoscopy, mammograms, etc.   MAMMOGRAMS:  All women over 40 years old should have a yearly mammogram. Many facilities now offer a "3D" mammogram, which may cost around $50 extra out of pocket. If possible,  we recommend you accept the option to have the 3D mammogram performed.  It both reduces the number of women who will be called back for extra views which then turn out to be normal, and it is better than the routine mammogram at detecting truly abnormal areas.    COLONOSCOPY:  Colonoscopy to screen for colon cancer is recommended for all women at age 50.  We know, you hate the idea of the prep.  We agree, BUT, having colon cancer and not knowing it is worse!!  Colon cancer so often starts as a polyp that can be seen and removed at colonscopy, which can quite literally save your life!  And if your first colonoscopy is normal and you have no family history of colon cancer, most women don't have to have it again for 10 years.  Once every ten years, you can do something that may end up saving your life, right?  We will be happy to help you get it scheduled when you are ready.    Be sure to check your insurance coverage so you understand how much it will cost.  It may be covered as a preventative service at no cost, but you should check your particular policy.     Menopause and Herbal Products Menopause is the normal time of life when menstrual periods stop completely. Menopause is complete when you have missed 12 consecutive menstrual periods. It usually occurs between the ages of 33 to 51, with an average age of 9. Very rarely does a woman develop menopause before 51 years old. At menopause, your ovaries stop producing the female  hormones, estrogen and progesterone. This can cause undesirable symptoms and also affect your health. Sometimes the symptoms can occur 4 to 5 years before the menopause begins. There is no relationship between menopause and:  Oral contraceptives.  Number of children you had.  Race.  The age your menstrual periods started (menarche). Heavy smokers and very thin women may develop menopause earlier in life. Estrogen and progesterone hormone treatment is the usual method of treating menopausal symptoms. However, there are women who should not take hormone treatment. This is true of:   Women that have breast or uterine cancer.  Women who prefer not to take hormones because of certain side effects (abnormal uterine bleeding).  Women who are afraid that hormones may cause breast cancer.  Women who have a history of liver disease, heart disease, stroke, or blood clots. For these women, there are other medications that may help treat their menopausal symptoms. These medications are found in plants and botanical products. They can be found in the form of herbs, teas, oils, tinctures, and pills.  CAUSES:  The ovaries stop producing the female hormones estrogen and progesterone.  Other causes include:  Surgery to remove both ovaries.  The ovaries stop functioning for no know reason.  Tumors of the pituitary gland in the brain.  Medical disease that affects the ovaries and hormone production.  Radiation treatment to the abdomen or pelvis.  Chemotherapy that affects the ovaries. PHYTOESTROGENS: Phytoestrogens occur naturally in plants and plant products. They act like estrogen in the body. Herbal medications are made from these plants and botanical steroids. There are 3 types of phytoestrogens:  Isoflavones (genistein and daidzein) are found in soy, garbanzo beans, miso and tofu foods.  Ligins are found in the shell of seeds. They are used to make oils like flaxseed oil. The bacteria in  your intestine act on these foods to produce the estrogen-like hormones.  Coumestans are estrogen-like. Some of the foods they are found in include sunflower seeds and bean sprouts. CONDITIONS AND THEIR POSSIBLE HERBAL TREATMENT:  Hot flashes and night sweats.  Soy, black cohosh and evening primrose.  Irritability, insomnia, depression and memory problems.  Chasteberry, ginseng, and soy.  St. John's wort may be helpful for depression. However, there is a concern of it causing cataracts of the eye and may have bad effects on other medications. St. John's wort should not be taken for long time and without your caregiver's advice.  Loss of libido and vaginal and skin dryness.  Wild yam and soy.  Prevention of coronary heart disease and osteoporosis.  Soy and Isoflavones. Several studies have shown that some women benefit from herbal medications, but most of the studies have not consistently shown that these supplements are much better than placebo. Other forms of treatment to help women with menopausal symptoms include a balanced diet, rest, exercise, vitamin and calcium (with vitamin D) supplements, acupuncture, and group therapy  when necessary. THOSE WHO SHOULD NOT TAKE HERBAL MEDICATIONS INCLUDE:  Women who are planning on getting pregnant unless told by your caregiver.  Women who are breastfeeding unless told by your caregiver.  Women who are taking other prescription medications unless told by your caregiver.  Infants, children, and elderly women unless told by your caregiver. Different herbal medications have different and unmeasured amounts of the herbal ingredients. There are no regulations, quality control, and standardization of the ingredients in herbal medications. Therefore, the amount of the ingredient in the medication may vary from one herb, pill, tea, oil or tincture to another. Many herbal medications can cause serious problems and can even have poisonous effects if  taken too much or too long. If problems develop, the medication should be stopped and recorded by your caregiver. HOME CARE INSTRUCTIONS  Do not take or give children herbal medications without your caregiver's advice.  Let your caregiver know all the medications you are taking. This includes prescription, over-the-counter, eye drops, and creams.  Do not take herbal medications longer or more than recommended.  Tell your caregiver about any side effects from the medication. SEEK MEDICAL CARE IF:  You develop a fever of 102 F (38.9 C), or as directed by your caregiver.  You feel sick to your stomach (nauseous), vomit, or have diarrhea.  You develop a rash.  You develop abdominal pain.  You develop severe headaches.  You start to have vision problems.  You feel dizzy or faint.  You start to feel numbness in any part of your body.  You start shaking (have convulsions). Document Released: 06/19/2007 Document Revised: 12/18/2011 Document Reviewed: 01/16/2010 [facility] Patient Information 2015 [place], [place]. This information is not intended to replace advice given to you by your health care provider. Make sure you discuss any questions you have with your health care provider.

## 2014-05-06 NOTE — Progress Notes (Signed)
Patient ID: Stefanie Rodriguez, female   DOB: 08-Oct-1965, 49 y.o.   MRN: [id number] 49 y.o. G29P3 Married Caucasian female here for annual exam.    Mirena IUD expired 08/2013.  Will have IUD removed next week.   Hot flashes.  Some night sweats.   Patient wants a pap.  Has a friend with cervical cancer.  Wants labs and hormonal testing.   Mother with history of breast cancer in her late 32s.  Paternal grandmother with breast cancer.  No FH of ovarian cancer.  Wants to meet with a genetics counselor about risk of breast cancer.   Just back from [place].  Gained weight.  Not happy about this.   PCP:   Kelton Pillar, MD  No LMP recorded. Patient is not currently having periods (Reason: IUD).          Sexually active: Yes.  female partner  The current method of family planning is none. --has Mirena IUD which expired 08/2013.   Exercising: Yes.    pilates and cardio. Smoker:  no  Health Maintenance: Pap:  04-11-12 wnl:neg HR HPV History of abnormal Pap:  no MMG:  08-16-13 implants--fibroglandular density/nl:[facility]. Colonoscopy:  Will schedule soon with Dr. Henrene Pastor BMD:   n/a  Result  n/a TDaP:  02/2008 Screening Labs:  Hb today:  13.7,  Urine today:  neg   reports that she quit smoking about 29 years ago. She has never used smokeless tobacco. She reports that she drinks about 1.8 oz of alcohol per week. She reports that she does not use illicit drugs.  Past Medical History  Diagnosis Date  . Palpitations   . Syncope and collapse   . Anxiety 11/2004  . Depression 11/2004  . STD (sexually transmitted disease)     Recurrent HSV Lower Back    Past Surgical History  Procedure Laterality Date  . Breast reduction surgery    . Breast enhancement surgery  11/2005    saline sub pectorial  . Cesarean section  08/1990  . Augmentation mammaplasty  11/07    Saline    Current Outpatient Prescriptions  Medication Sig Dispense Refill  . ALPRAZolam (XANAX) 0.25 MG tablet Take 1  tablet (0.25 mg total) by mouth at bedtime as needed for anxiety. 30 tablet 0  . calcium carbonate (OS-CAL) 600 MG TABS tablet Take 600 mg by mouth 2 (two) times daily with a meal.    . levonorgestrel (MIRENA) 20 MCG/24HR IUD 1 each by Intrauterine route once.    . multivitamin (THERAGRAN) per tablet Take 1 tablet by mouth daily.    . valACYclovir (VALTREX) 500 MG tablet Take 1 tablet (500 mg total) by mouth 2 (two) times daily. Take one tablet BID at onset of symptoms for 3 days. 12 tablet 1   No current facility-administered medications for this visit.    Family History  Problem Relation Age of Onset  . Coronary artery disease Neg Hx   . Breast cancer Mother 83  . Polycythemia Mother   . Heart attack Mother   . Heart attack Father   . Parkinson's disease Father   . Hypertension Father   . Hyperlipidemia Father   . Breast cancer Paternal Grandmother     ROS:  Pertinent items are noted in HPI.  Otherwise, a comprehensive ROS was negative.  Exam:   BP 120/82 mmHg  Pulse 76  Resp 22  Ht 5\' 5"  (1.651 m)  Wt 164 lb 9.6 oz (74.662 kg)  BMI 27.39  kg/m2  LMP      General appearance: alert, cooperative and appears stated age Head: Normocephalic, without obvious abnormality, atraumatic Neck: no adenopathy, supple, symmetrical, trachea midline and thyroid normal to inspection and palpation Lungs: clear to auscultation bilaterally Breasts: normal appearance, no masses or tenderness, No nipple retraction or dimpling, No nipple discharge or bleeding, No axillary or supraclavicular adenopathy, Breasts consistent with reduction and implants placed bilaterally. Heart: regular rate and rhythm Abdomen: soft, non-tender; bowel sounds normal; no masses,  no organomegaly Extremities: extremities normal, atraumatic, no cyanosis or edema Skin: Skin color, texture, turgor normal. No rashes or lesions Lymph nodes: Cervical, supraclavicular, and axillary nodes normal. No abnormal inguinal nodes  palpated Neurologic: Grossly normal  Pelvic: External genitalia:  no lesions              Urethra:  normal appearing urethra with no masses, tenderness or lesions              Bartholins and Skenes: normal                 Vagina: normal appearing vagina with normal color and discharge, no lesions              Cervix: no lesions and IUD strings present.               Pap taken: Yes.   Bimanual Exam:  Uterus:  normal size, contour, position, consistency, mobility, non-tender              Adnexa: normal adnexa and no mass, fullness, tenderness              Rectovaginal: Yes.  .  Confirms.              Anus:  normal sphincter tone, no lesions  Chaperone was present for exam.  Assessment:   Well woman visit with normal exam. Mirena IUD expired. Menopausal symptoms. FH of breast cancer.  Plan: Yearly mammogram recommended after age 11.  Recommended self breast exam.  Referral for genetic counseling at [facility].  Pap and HR HPV as above. Discussed regular exercise program including cardiovascular and weight bearing exercise. Labs performed.  Yes.   See orders. Refills given on medications.  No.   Return for IUD removal next week. Follow up annually and prn.   Additional counseling given regarding menopausal symptoms and treatment with herbal options.  Declines HRT.  After visit summary provided.

## 2014-05-07 LAB — FOLLICLE STIMULATING HORMONE: FSH: 58.7 m[IU]/mL

## 2014-05-07 LAB — ESTRADIOL: Estradiol: 42.2 pg/mL

## 2014-05-09 ENCOUNTER — Telehealth: Payer: Self-pay | Admitting: Genetic Counselor

## 2014-05-09 ENCOUNTER — Telehealth: Payer: Self-pay | Admitting: Emergency Medicine

## 2014-05-09 LAB — HEMOGLOBIN, FINGERSTICK: HEMOGLOBIN, FINGERSTICK: 13.7 g/dL (ref 12.0–16.0)

## 2014-05-09 NOTE — Telephone Encounter (Signed)
Patient agreeable to changing appointment time for her IUD removal. Scheduled for 05/11/14 at 0830. Routing to provider for final review. Patient agreeable to disposition. Will close encounter

## 2014-05-09 NOTE — Telephone Encounter (Signed)
Spoke with patient and scheduled gen couseling appt with:  Roma Kayser 05/18/14 10 am  Dx: Family Hx of Breast Cancer Referring: Dr. Alvy Bimler

## 2014-05-11 ENCOUNTER — Encounter: Payer: Self-pay | Admitting: Obstetrics and Gynecology

## 2014-05-11 ENCOUNTER — Ambulatory Visit (INDEPENDENT_AMBULATORY_CARE_PROVIDER_SITE_OTHER): Payer: BLUE CROSS/BLUE SHIELD | Admitting: Obstetrics and Gynecology

## 2014-05-11 ENCOUNTER — Ambulatory Visit: Payer: Self-pay | Admitting: Obstetrics and Gynecology

## 2014-05-11 VITALS — BP 112/62 | HR 66 | Ht 65.0 in | Wt 163.2 lb

## 2014-05-11 DIAGNOSIS — Z30432 Encounter for removal of intrauterine contraceptive device: Secondary | ICD-10-CM

## 2014-05-11 DIAGNOSIS — N951 Menopausal and female climacteric states: Secondary | ICD-10-CM

## 2014-05-11 DIAGNOSIS — E7800 Pure hypercholesterolemia, unspecified: Secondary | ICD-10-CM

## 2014-05-11 LAB — POCT URINE PREGNANCY: Preg Test, Ur: NEGATIVE

## 2014-05-11 LAB — IPS PAP TEST WITH HPV

## 2014-05-11 MED ORDER — NORETHIN-ETH ESTRAD-FE BIPHAS 1 MG-10 MCG / 10 MCG PO TABS: 1.0000 | ORAL_TABLET | Freq: Every day | ORAL | Status: DC

## 2014-05-11 NOTE — Progress Notes (Signed)
Patient ID: Stefanie Rodriguez, female   DOB: 12-25-1965, 49 y.o.   MRN: [id number] GYNECOLOGY  VISIT   HPI: 49 y.o.   Married  Caucasian  female   G5P3 with No LMP recorded. Patient is not currently having periods (Reason: IUD).   here for Mirena IUD removal. FSH 58.7 and E2 42.2. Uses Mirena for birth control and heavy cycles.  Wondering if she should go back on a low dose OCP. Not a smoker.  UPT - negative  GYNECOLOGIC HISTORY: No LMP recorded. Patient is not currently having periods (Reason: IUD). Contraception: Mirena IUD--which expire 08/2013   Menopausal hormone therapy: n/a Last pap:  05-06-14 wnl:neg HR HPV Last mammogram:  08-13-13 saline implants/fibroglandular density/nl:[facility]        OB History    Gravida Para Term Preterm AB TAB SAB Ectopic Multiple Living   5 3        3          Patient Active Problem List   Diagnosis Date Noted  . Anxiety state, unspecified 10/16/2012  . Syncope 05/29/2011  . Palpitations 05/29/2011  . Blurred vision 05/29/2011    Past Medical History  Diagnosis Date  . Palpitations   . Syncope and collapse   . Anxiety 11/2004  . Depression 11/2004  . STD (sexually transmitted disease)     Recurrent HSV Lower Back    Past Surgical History  Procedure Laterality Date  . Breast reduction surgery    . Breast enhancement surgery  11/2005    saline sub pectorial  . Cesarean section  08/1990  . Augmentation mammaplasty  11/07    Saline    Current Outpatient Prescriptions  Medication Sig Dispense Refill  . ALPRAZolam (XANAX) 0.25 MG tablet Take 1 tablet (0.25 mg total) by mouth at bedtime as needed for anxiety. 30 tablet 0  . calcium carbonate (OS-CAL) 600 MG TABS tablet Take 600 mg by mouth 2 (two) times daily with a meal.    . levonorgestrel (MIRENA) 20 MCG/24HR IUD 1 each by Intrauterine route once.    . multivitamin (THERAGRAN) per tablet Take 1 tablet by mouth daily.    . valACYclovir (VALTREX) 500 MG tablet Take 1 tablet  (500 mg total) by mouth 2 (two) times daily. Take one tablet BID at onset of symptoms for 3 days. 12 tablet 1   No current facility-administered medications for this visit.     ALLERGIES: Review of patient's allergies indicates no known allergies.  Family History  Problem Relation Age of Onset  . Coronary artery disease Neg Hx   . Breast cancer Mother 55  . Polycythemia Mother   . Heart attack Mother   . Heart attack Father   . Parkinson's disease Father   . Hypertension Father   . Hyperlipidemia Father   . Breast cancer Paternal Grandmother     History   Social History  . Marital Status: Married    Spouse Name: N/A  . Number of Children: N/A  . Years of Education: N/A   Occupational History  . Not on file.   Social History Main Topics  . Smoking status: Former Smoker    Quit date: 01/14/1985  . Smokeless tobacco: Never Used  . Alcohol Use: 1.8 oz/week    3 Standard drinks or equivalent per week     Comment: 3 glasses of wine a week  . Drug Use: No  . Sexual Activity:    Partners: Male    Birth Control/ Protection:  IUD, None     Comment: mirena inserted 09/13/08--EXPIRED   Other Topics Concern  . Not on file   Social History Narrative    ROS:  Pertinent items are noted in HPI.  PHYSICAL EXAMINATION:    BP 112/62 mmHg  Pulse 66  Ht 5\' 5"  (1.651 m)  Wt 163 lb 3.2 oz (74.027 kg)  BMI 27.16 kg/m2     General appearance: alert, cooperative and appears stated age Lungs: clear to auscultation bilaterally Heart: regular rate and rhythm Abdomen: soft, non-tender; no masses,  no organomegaly No abnormal inguinal nodes palpated  Pelvic: External genitalia:  no lesions              Urethra:  normal appearing urethra with no masses, tenderness or lesions              Bartholins and Skenes: normal                 Vagina: normal appearing vagina with normal color and discharge, no lesions              Cervix: normal appearance            IUD Removal Consent  for procedure.  IUD stings grasped and removed with ring forceps.  IUD intact and discarded.  ASSESSMENT  Expired Mirena IUD.  Perimenopausal female.  Menopausal symptoms.  Elevated LDL cholesterol.  FH of CAD.   PLAN  Start LoLoestrin OCPs.  Risks of DVT, PE, MI, stroke discussed.   Will follow up with PCP regarding elevated cholesterol. Discussed proper diet.  Follow up in 3 months for a recheck.    An After Visit Summary was printed and given to the patient.  _15_____ minutes face to face time of which over 50% was spent in counseling.

## 2014-05-18 ENCOUNTER — Ambulatory Visit (HOSPITAL_BASED_OUTPATIENT_CLINIC_OR_DEPARTMENT_OTHER): Payer: BLUE CROSS/BLUE SHIELD | Admitting: Genetic Counselor

## 2014-05-18 ENCOUNTER — Other Ambulatory Visit: Payer: Self-pay

## 2014-05-18 ENCOUNTER — Encounter: Payer: Self-pay | Admitting: Genetic Counselor

## 2014-05-18 DIAGNOSIS — Z803 Family history of malignant neoplasm of breast: Secondary | ICD-10-CM

## 2014-05-18 NOTE — Progress Notes (Signed)
REFERRING PROVIDER: Kelton Pillar, MD [address], [place] [address]   Josefa Half, MD  PRIMARY PROVIDER:  Osborne Casco, MD  PRIMARY REASON FOR VISIT:  1. Family history of breast cancer      HISTORY OF PRESENT ILLNESS:   Ms. Mcwethy, a 49 y.o. female, was seen for a  cancer genetics consultation at the request of Dr. Quincy Simmonds due to a family history of cancer.  Ms. Perl presents to clinic today to discuss the possibility of a hereditary predisposition to cancer, genetic testing, and to further clarify her future cancer risks, as well as potential cancer risks for family members. Ms. Lonzo has no personal history of cancer.  Based on her family history she was concerned whether she may be at risk for a hereditary cancer syndrome.  CANCER HISTORY:   No history exists.     HORMONAL RISK FACTORS:  Menarche was at age 26-13.  First live birth at age 74.  OCP use for approximately 6 years.  Ovaries intact: yes.  Hysterectomy: no.  Menopausal status: perimenopausal.  HRT use: 0 years. Colonoscopy: no; not examined. Mammogram within the last year: yes. Number of breast biopsies: 0. Up to date with pelvic exams:  yes. Any excessive radiation exposure in the past:  no  Past Medical History  Diagnosis Date  . Palpitations   . Syncope and collapse   . Anxiety 11/2004  . Depression 11/2004  . STD (sexually transmitted disease)     Recurrent HSV Lower Back  . Family history of breast cancer     Past Surgical History  Procedure Laterality Date  . Breast reduction surgery    . Breast enhancement surgery  11/2005    saline sub pectorial  . Cesarean section  08/1990  . Augmentation mammaplasty  11/07    Saline    History   Social History  . Marital Status: Married    Spouse Name: N/A  . Number of Children: N/A  . Years of Education: N/A   Social History Main Topics  . Smoking status: Former Smoker -- 2 years    Types: Cigarettes     Quit date: 01/14/1985  . Smokeless tobacco: Never Used  . Alcohol Use: 1.8 oz/week    3 Standard drinks or equivalent per week     Comment: 3 glasses of wine a week  . Drug Use: No  . Sexual Activity:    Partners: Male    Patent examiner Protection: IUD, None     Comment: mirena inserted 09/13/08--EXPIRED   Other Topics Concern  . None   Social History Narrative     FAMILY HISTORY:  We obtained a detailed, 4-generation family history.  Significant diagnoses are listed below: Family History  Problem Relation Age of Onset  . Coronary artery disease Neg Hx   . Breast cancer Mother 60  . Polycythemia Mother   . Heart attack Mother   . Heart attack Father   . Parkinson's disease Father   . Hypertension Father   . Hyperlipidemia Father   . Breast cancer Paternal Grandmother 39  . Cancer Maternal Uncle     NOS  . Bone cancer Maternal Grandmother 60  . Heart attack Maternal Grandfather   . Heart attack Paternal Grandfather    The patient has one son and two daughters who are healthy and cancer free.  She has a sister and brother who have never been diagnosed with cancer.  Her mother had breast cancer  at age 29 which was treated with lumpectomy and radiation.  She is now 15.  Her mother had one brother who died of a NOS cancer.  He had 5 children who are cancer free.  Ms. Sarkis' maternal grandmother had bone cancer, which she thinks was a primary cancer, and her grandmother's sister had some form of cancer.  Ms. Terance Ice father died of Parkinson's disease at age 35.  He has one sister who is alive and cancer free.  His mother had breast cancer at age 33 and his father died of a heart attack. Patient's maternal ancestors are of Caucasian descent, and paternal ancestors are of English descent. There is no reported Ashkenazi Jewish ancestry. There is no known consanguinity.  GENETIC COUNSELING ASSESSMENT: KEALIE BARRIE is a 49 y.o. female with a family history of cancer which somewhat  suggestive of a familial vs sporadic disease, but not hereditary, and a predisposition to cancer. We, therefore, discussed and recommended the following at today's visit.   DISCUSSION: We discussed with Ms. Rail that the family history is not highly consistent with a familial hereditary cancer syndrome, and we feel she is at low risk to harbor a gene mutation associated with such a condition. Thus, we did not recommend any genetic testing, at this time, and recommended Ms. Zemaitis continue to follow the cancer screening guidelines given by her primary healthcare provider.  In order to estimate her chance of having a BRCA mutation, we used statistical models (Penn II, Myriad Risk Calculator and [employer]) and laboratory data that take into account her personal medical history, family history and ancestry.  Because each model is different, there can be a lot of variability in the risks they give.  Therefore, these numbers must be considered a rough range and not a precise risk of having a BRCA mutation.  These models estimate that she has approximately a 0.50-2.0% chance of having a mutation. Based on this assessment of her family and personal history, genetic testing is not recommended.  Based on the patient's personal and family history, statistical models (Tyrer Cusik and [place])  and literature data were used to estimate her risk of developing Breast cancer. These estimate her lifetime risk of developing breast cancer to be approximately 16.8% to 24%. This estimation does not take into account any genetic testing results.  The patient's lifetime breast cancer risk is a preliminary estimate based on available information using one of several models endorsed by the [place] (ACS). The ACS recommends consideration of breast MRI screening as an adjunct to mammography for patients at high risk (defined as 20% or greater lifetime risk). A more detailed breast cancer risk assessment can be considered,  if clinically indicated.   PLAN: Based on the breast cancer calculation, we recommend that Ms. Mccoy' discuss with her providers about the benefits of a breast MRI.  We discussed that her lifetime risk for breast cancer will change over time so this calculation will need to be repeated in the future.   Lastly, we encouraged Ms. Jefferys to remain in contact with cancer genetics annually so that we can continuously update the family history and inform her of any changes in cancer genetics and testing that may be of benefit for this family.   Ms.  Hermida questions were answered to her satisfaction today. Our contact information was provided should additional questions or concerns arise. Thank you for the referral and allowing [place] to share in the care of your patient.  Karen P. Florene Glen, [place], [facility] Certified Genetic Counselor [email or web address] phone: [phone]  The patient was seen for a total of 40 minutes in face-to-face genetic counseling.  This patient was discussed with Drs. Magrinat, Lindi Adie and/or Burr Medico who agrees with the above.    _______________________________________________________________________ For Office Staff:  Number of people involved in session: 1 Was an Intern/ student involved with case: no

## 2014-05-19 ENCOUNTER — Telehealth: Payer: Self-pay

## 2014-05-19 DIAGNOSIS — Z803 Family history of malignant neoplasm of breast: Secondary | ICD-10-CM

## 2014-05-19 NOTE — Telephone Encounter (Signed)
Returning a call to Kaitlyn. °

## 2014-05-19 NOTE — Telephone Encounter (Signed)
Message     Please contact patient to see if she is interested in proceeding forward with a breast MRI due to increased lifetime risk of breast cancer.         She just had genetics counseling at the [facility].     No genetic testing was done based on recommendation of [place].         Mammogram was done in April 2016.         I will authorize the MRI if patient would like to proceed.         Josefa Half, MD        ----- Message -----     From: Clarene Essex, Donia Pounds     Sent: 05/18/2014 11:00 AM      To: Nunzio Cobbs, MD            Left message to call Verline Lema at [phone].

## 2014-05-24 NOTE — Telephone Encounter (Signed)
Left message to call Kaitlyn at [phone]. 

## 2014-05-26 NOTE — Telephone Encounter (Signed)
Spoke with patient. Advised of message as seen below from [place]. Patient would like to proceed with breast MRI at this time. Requesting appointment for Monday, Wednesday, or Friday. Advised will have MRI order placed and schedule appointment. Advised will need to be authorized by insurance which we will do here in the office and give her a call to let her know. Patient is agreeable.

## 2014-05-26 NOTE — Telephone Encounter (Signed)
Spoke with [facility] patient's last mammogram was 08/13/2013. Patient needs mammogram within a month of breast MRI for coverage. Left message to call [place] at [phone]. Has patient had imaging at another location?

## 2014-05-30 NOTE — Telephone Encounter (Signed)
Spoke with patient. Last mammogram was performed on 08/13/2013. Patient has not had further imaging since then. Advised patient MRI will need to be performed within 30 days after recent mammogram for insurance coverage. Patient is agreeable. Would like to proceed with mammogram after next screening which is due in July of this year. Patient is agreeable.  Routing to provider for final review. Patient agreeable to disposition. Will close encounter.   Patient aware provider will review message and nurse will return call if any additional advice or change of disposition.

## 2014-08-15 ENCOUNTER — Telehealth: Payer: Self-pay | Admitting: Obstetrics and Gynecology

## 2014-08-15 NOTE — Telephone Encounter (Signed)
Patient left a message canceling her upcoming recheck appointment 08/17/14. Patient to call later to reschedule.

## 2014-08-16 NOTE — Telephone Encounter (Signed)
FYI--Dr. Quincy Simmonds this was 3 mo. F/u after beginning OCPs.

## 2014-08-16 NOTE — Telephone Encounter (Signed)
Needs a recheck for pill refills.  She has one more month by my calculation.   Encounter closed.

## 2014-08-17 ENCOUNTER — Ambulatory Visit: Payer: BLUE CROSS/BLUE SHIELD | Admitting: Obstetrics and Gynecology

## 2014-08-24 ENCOUNTER — Telehealth: Payer: Self-pay | Admitting: Emergency Medicine

## 2014-08-24 NOTE — Telephone Encounter (Signed)
Patient due for screening mammogram and breast MRI is ordered for her. Calling to offer assistance with scheduling appointments and planning for MRI if patient agreeable.    Message left to return call to [place] at [phone].

## 2014-09-13 ENCOUNTER — Other Ambulatory Visit: Payer: Self-pay

## 2014-09-13 DIAGNOSIS — Z1231 Encounter for screening mammogram for malignant neoplasm of breast: Secondary | ICD-10-CM

## 2014-09-22 ENCOUNTER — Ambulatory Visit
Admission: RE | Admit: 2014-09-22 | Discharge: 2014-09-22 | Disposition: A | Discharge status: Home / Self Care | Payer: BLUE CROSS/BLUE SHIELD | Source: Ambulatory Visit

## 2014-09-22 DIAGNOSIS — Z1231 Encounter for screening mammogram for malignant neoplasm of breast: Secondary | ICD-10-CM

## 2014-09-22 NOTE — Telephone Encounter (Signed)
Message left to return call to Joury Allcorn at [phone].    

## 2014-09-22 NOTE — Telephone Encounter (Signed)
Dr. Quincy Simmonds,  Patient had mammogram today.  Bi-rads 1 Negative.  Okay to contact and discuss MRI of breast planning and also follow up with you?

## 2014-09-22 NOTE — Telephone Encounter (Signed)
Please proceed forward with precerting breast MRI.   Please also schedule a recheck appointment with me.   Patient cancelled her 3 month recheck appointment after starting combined oral contraceptives.   Thanks!

## 2014-09-26 ENCOUNTER — Telehealth: Payer: Self-pay | Admitting: Obstetrics and Gynecology

## 2014-09-26 ENCOUNTER — Other Ambulatory Visit: Payer: Self-pay | Admitting: Obstetrics and Gynecology

## 2014-09-26 DIAGNOSIS — Z803 Family history of malignant neoplasm of breast: Secondary | ICD-10-CM

## 2014-09-26 NOTE — Telephone Encounter (Signed)
Please see encounter dated 08/24/14 that is already opened.  Will close encounter.

## 2014-09-26 NOTE — Telephone Encounter (Signed)
Patient returning Tracy's call. °

## 2014-09-26 NOTE — Telephone Encounter (Signed)
Patient returned call.   She is advised that Mammogram is normal and is ready to proceed with breast MRI. Advised will need breast MRI within 2-3 months after mammogram as comparison is required for MRI.  She is agreeable. Advised [facility] will contact her to schedule.   Patient states she has not started LoLoestrin as she has not started a cycle since IUD was removed on 05/11/14.  05/06/14 FSH was 58.7.   Advised patient will request that Dr. Quincy Simmonds review and advise. Patient is sexually active, feels no risk for pregnancy. Advised will return call with instructions. Patient agreeable.

## 2014-09-28 NOTE — Telephone Encounter (Signed)
Message left to return call to Neomi Laidler at [phone].    

## 2014-09-28 NOTE — Telephone Encounter (Signed)
Dr. Quincy Simmonds,  Patient has not started cycle since IUD removed 05/11/14. Has not started Loloestrin.    MRI scheduled for 10/11/14

## 2014-09-28 NOTE — Telephone Encounter (Signed)
I would have patient do a Provera challenge 10 mg po q day for 10 days.  This will let us know what her endogenous estrogen status is.   Be prepared for a potential period within about 2 weeks of finishing prescription.  This will allow the lining of the uterus to shed if she is not menopausal yet.   Her Dixon was elevated but so was her estradiol level when she did her hormonal testing this year.

## 2014-09-30 MED ORDER — MEDROXYPROGESTERONE ACETATE 10 MG PO TABS: 10.0000 mg | ORAL_TABLET | Freq: Every day | ORAL | Status: DC

## 2014-09-30 NOTE — Telephone Encounter (Signed)
Call to patient and she is given instructions from Dr. Quincy Simmonds.  Will start provera 10 mg po q day for 10 days. She will call back if no cycle within 2 weeks. Advised if starts cycle start Loloestrin.  Patient verbalized understanding of instructions and will call back as needed or if no cycle in two weeks after completing Provera.  Routing to Aleja Yearwood for final review. Patient agreeable to disposition. Will close encounter.

## 2014-10-11 ENCOUNTER — Ambulatory Visit
Admission: RE | Admit: 2014-10-11 | Discharge: 2014-10-11 | Disposition: A | Discharge status: Home / Self Care | Payer: BLUE CROSS/BLUE SHIELD | Source: Ambulatory Visit | Attending: Obstetrics and Gynecology | Admitting: Obstetrics and Gynecology

## 2014-10-11 DIAGNOSIS — Z803 Family history of malignant neoplasm of breast: Secondary | ICD-10-CM

## 2014-10-11 MED ORDER — GADOBENATE DIMEGLUMINE 529 MG/ML IV SOLN
13.0000 mL | Freq: Once | INTRAVENOUS | Status: AC | PRN
  Administered 2014-10-11: 13 mL via INTRAVENOUS

## 2015-05-15 ENCOUNTER — Encounter: Payer: Self-pay | Admitting: Internal Medicine

## 2015-06-16 ENCOUNTER — Ambulatory Visit (INDEPENDENT_AMBULATORY_CARE_PROVIDER_SITE_OTHER): Payer: BLUE CROSS/BLUE SHIELD | Admitting: Obstetrics and Gynecology

## 2015-06-16 ENCOUNTER — Encounter: Payer: Self-pay | Admitting: Obstetrics and Gynecology

## 2015-06-16 VITALS — BP 100/66 | HR 70 | Resp 16 | Ht 65.0 in | Wt 127.4 lb

## 2015-06-16 DIAGNOSIS — Z01419 Encounter for gynecological examination (general) (routine) without abnormal findings: Secondary | ICD-10-CM

## 2015-06-16 DIAGNOSIS — Z113 Encounter for screening for infections with a predominantly sexual mode of transmission: Secondary | ICD-10-CM | POA: Diagnosis not present

## 2015-06-16 DIAGNOSIS — N912 Amenorrhea, unspecified: Secondary | ICD-10-CM | POA: Diagnosis not present

## 2015-06-16 NOTE — Patient Instructions (Signed)
Health Maintenance, Female Adopting a healthy lifestyle and getting preventive care can go a long way to promote health and wellness. Talk with your health care provider about what schedule of regular examinations is right for you. This is a good chance for you to check in with your provider about disease prevention and staying healthy. In between checkups, there are plenty of things you can do on your own. Experts have done a lot of research about which lifestyle changes and preventive measures are most likely to keep you healthy. Ask your health care provider for more information. WEIGHT AND DIET  Eat a healthy diet  Be sure to include plenty of vegetables, fruits, low-fat dairy products, and lean protein.  Do not eat a lot of foods high in solid fats, added sugars, or salt.  Get regular exercise. This is one of the most important things you can do for your health.  Most adults should exercise for at least 150 minutes each week. The exercise should increase your heart rate and make you sweat (moderate-intensity exercise).  Most adults should also do strengthening exercises at least twice a week. This is in addition to the moderate-intensity exercise.  Maintain a healthy weight  Body mass index (BMI) is a measurement that can be used to identify possible weight problems. It estimates body fat based on height and weight. Your health care provider can help determine your BMI and help you achieve or maintain a healthy weight.  For females 20 years of age and older:   A BMI below 18.5 is considered underweight.  A BMI of 18.5 to 24.9 is normal.  A BMI of 25 to 29.9 is considered overweight.  A BMI of 30 and above is considered obese.  Watch levels of cholesterol and blood lipids  You should start having your blood tested for lipids and cholesterol at 52 years of age, then have this test every 5 years.  You may need to have your cholesterol levels checked more often if:  Your lipid  or cholesterol levels are high.  You are older than 52 years of age.  You are at high risk for heart disease.  CANCER SCREENING   Lung Cancer  Lung cancer screening is recommended for adults 55-80 years old who are at high risk for lung cancer because of a history of smoking.  A yearly low-dose CT scan of the lungs is recommended for people who:  Currently smoke.  Have quit within the past 15 years.  Have at least a 30-pack-year history of smoking. A pack year is smoking an average of one pack of cigarettes a day for 1 year.  Yearly screening should continue until it has been 15 years since you quit.  Yearly screening should stop if you develop a health problem that would prevent you from having lung cancer treatment.  Breast Cancer  Practice breast self-awareness. This means understanding how your breasts normally appear and feel.  It also means doing regular breast self-exams. Let your health care provider know about any changes, no matter how small.  If you are in your 20s or 30s, you should have a clinical breast exam (CBE) by a health care provider every 1-3 years as part of a regular health exam.  If you are 40 or older, have a CBE every year. Also consider having a breast X-ray (mammogram) every year.  If you have a family history of breast cancer, talk to your health care provider about genetic screening.  If you   are at high risk for breast cancer, talk to your health care provider about having an MRI and a mammogram every year.  Breast cancer gene (BRCA) assessment is recommended for women who have family members with BRCA-related cancers. BRCA-related cancers include:  Breast.  Ovarian.  Tubal.  Peritoneal cancers.  Results of the assessment will determine the need for genetic counseling and BRCA1 and BRCA2 testing. Cervical Cancer Your health care provider may recommend that you be screened regularly for cancer of the pelvic organs (ovaries, uterus, and  vagina). This screening involves a pelvic examination, including checking for microscopic changes to the surface of your cervix (Pap test). You may be encouraged to have this screening done every 3 years, beginning at age 21.  For women ages 30-65, health care providers may recommend pelvic exams and Pap testing every 3 years, or they may recommend the Pap and pelvic exam, combined with testing for human papilloma virus (HPV), every 5 years. Some types of HPV increase your risk of cervical cancer. Testing for HPV may also be done on women of any age with unclear Pap test results.  Other health care providers may not recommend any screening for nonpregnant women who are considered low risk for pelvic cancer and who do not have symptoms. Ask your health care provider if a screening pelvic exam is right for you.  If you have had past treatment for cervical cancer or a condition that could lead to cancer, you need Pap tests and screening for cancer for at least 20 years after your treatment. If Pap tests have been discontinued, your risk factors (such as having a new sexual partner) need to be reassessed to determine if screening should resume. Some women have medical problems that increase the chance of getting cervical cancer. In these cases, your health care provider may recommend more frequent screening and Pap tests. Colorectal Cancer  This type of cancer can be detected and often prevented.  Routine colorectal cancer screening usually begins at 52 years of age and continues through 52 years of age.  Your health care provider may recommend screening at an earlier age if you have risk factors for colon cancer.  Your health care provider may also recommend using home test kits to check for hidden blood in the stool.  A small camera at the end of a tube can be used to examine your colon directly (sigmoidoscopy or colonoscopy). This is done to check for the earliest forms of colorectal  cancer.  Routine screening usually begins at age 50.  Direct examination of the colon should be repeated every 5-10 years through 52 years of age. However, you may need to be screened more often if early forms of precancerous polyps or small growths are found. Skin Cancer  Check your skin from head to toe regularly.  Tell your health care provider about any new moles or changes in moles, especially if there is a change in a mole's shape or color.  Also tell your health care provider if you have a mole that is larger than the size of a pencil eraser.  Always use sunscreen. Apply sunscreen liberally and repeatedly throughout the day.  Protect yourself by wearing long sleeves, pants, a wide-brimmed hat, and sunglasses whenever you are outside. HEART DISEASE, DIABETES, AND HIGH BLOOD PRESSURE   High blood pressure causes heart disease and increases the risk of stroke. High blood pressure is more likely to develop in:  People who have blood pressure in the high end   of the normal range (130-139/85-89 mm Hg).  People who are overweight or obese.  People who are African American.  If you are 38-23 years of age, have your blood pressure checked every 3-5 years. If you are 61 years of age or older, have your blood pressure checked every year. You should have your blood pressure measured twice--once when you are at a hospital or clinic, and once when you are not at a hospital or clinic. Record the average of the two measurements. To check your blood pressure when you are not at a hospital or clinic, you can use:  An automated blood pressure machine at a pharmacy.  A home blood pressure monitor.  If you are between 45 years and 39 years old, ask your health care provider if you should take aspirin to prevent strokes.  Have regular diabetes screenings. This involves taking a blood sample to check your fasting blood sugar level.  If you are at a normal weight and have a low risk for diabetes,  have this test once every three years after 52 years of age.  If you are overweight and have a high risk for diabetes, consider being tested at a younger age or more often. PREVENTING INFECTION  Hepatitis B  If you have a higher risk for hepatitis B, you should be screened for this virus. You are considered at high risk for hepatitis B if:  You were born in a country where hepatitis B is common. Ask your health care provider which countries are considered high risk.  Your parents were born in a high-risk country, and you have not been immunized against hepatitis B (hepatitis B vaccine).  You have HIV or AIDS.  You use needles to inject street drugs.  You live with someone who has hepatitis B.  You have had sex with someone who has hepatitis B.  You get hemodialysis treatment.  You take certain medicines for conditions, including cancer, organ transplantation, and autoimmune conditions. Hepatitis C  Blood testing is recommended for:  Everyone born from [birth year] through 1967.  Anyone with known risk factors for hepatitis C. Sexually transmitted infections (STIs)  You should be screened for sexually transmitted infections (STIs) including gonorrhea and chlamydia if:  You are sexually active and are younger than 52 years of age.  You are older than 53 years of age and your health care provider tells you that you are at risk for this type of infection.  Your sexual activity has changed since you were last screened and you are at an increased risk for chlamydia or gonorrhea. Ask your health care provider if you are at risk.  If you do not have HIV, but are at risk, it may be recommended that you take a prescription medicine daily to prevent HIV infection. This is called pre-exposure prophylaxis (PrEP). You are considered at risk if:  You are sexually active and do not regularly use condoms or know the HIV status of your partner(s).  You take drugs by injection.  You are sexually  active with a partner who has HIV. Talk with your health care provider about whether you are at high risk of being infected with HIV. If you choose to begin PrEP, you should first be tested for HIV. You should then be tested every 3 months for as long as you are taking PrEP.  PREGNANCY   If you are premenopausal and you may become pregnant, ask your health care provider about preconception counseling.  If you may  become pregnant, take 400 to 800 micrograms (mcg) of folic acid every day.  If you want to prevent pregnancy, talk to your health care provider about birth control (contraception). OSTEOPOROSIS AND MENOPAUSE   Osteoporosis is a disease in which the bones lose minerals and strength with aging. This can result in serious bone fractures. Your risk for osteoporosis can be identified using a bone density scan.  If you are 61 years of age or older, or if you are at risk for osteoporosis and fractures, ask your health care provider if you should be screened.  Ask your health care provider whether you should take a calcium or vitamin D supplement to lower your risk for osteoporosis.  Menopause may have certain physical symptoms and risks.  Hormone replacement therapy may reduce some of these symptoms and risks. Talk to your health care provider about whether hormone replacement therapy is right for you.  HOME CARE INSTRUCTIONS   Schedule regular health, dental, and eye exams.  Stay current with your immunizations.   Do not use any tobacco products including cigarettes, chewing tobacco, or electronic cigarettes.  If you are pregnant, do not drink alcohol.  If you are breastfeeding, limit how much and how often you drink alcohol.  Limit alcohol intake to no more than 1 drink per day for nonpregnant women. One drink equals 12 ounces of beer, 5 ounces of wine, or 1 ounces of hard liquor.  Do not use street drugs.  Do not share needles.  Ask your health care provider for help if  you need support or information about quitting drugs.  Tell your health care provider if you often feel depressed.  Tell your health care provider if you have ever been abused or do not feel safe at home.   This information is not intended to replace advice given to you by your health care provider. Make sure you discuss any questions you have with your health care provider.   Document Released: 07/16/2010 Document Revised: 01/21/2014 Document Reviewed: 12/02/2012 Elsevier Interactive Patient Education [employer].

## 2015-06-16 NOTE — Progress Notes (Signed)
Patient ID: Stefanie Rodriguez, female   DOB: [birth year], 50 y.o.   MRN: [id number] 50 y.o. G53P3 Married Caucasian female here for annual exam.    LMP - April 2016 shortly after Mirena removed.   Lost weight.  Lost almost 40 pounds.  Did dietary change and exercise.  Feeling really good.  Some hot flashes but not extensive.  Recently prescribed Fosamax by per PCP. Has questions about her new diagnosis of osteoporosis.  PCP: Velna Hatchet, MD     Sexually active: Yes.   female The current method of family planning is none.    Exercising: Yes.    cardio and light weights. Smoker:  no  Health Maintenance: Pap:  05-06-14 Neg:Neg HR HPV History of abnormal Pap:  no MMG:  09-22-14 Implants--3D/Density B/Neg/BiRads1:[facility].  Breast MRI Sept 2016 - normal.  Colonoscopy:  Scheduled in July with Dr. Henrene Pastor  BMD:   03/2015  Result  Osteoporosis TDaP:  02/2008 Gardasil:   N/A HIV: Negative in pregnancy.  Hep C:  Today.  Screening Labs:  Hb today: PCP, Urine today: unable to void  reports that she quit smoking about 30 years ago. Her smoking use included Cigarettes. She quit after 2 years of use. She has never used smokeless tobacco. She reports that she drinks about 1.8 oz of alcohol per week. She reports that she does not use illicit drugs.  Past Medical History  Diagnosis Date  . Palpitations   . Syncope and collapse   . Anxiety 11/2004  . Depression 11/2004  . STD (sexually transmitted disease)     Recurrent HSV Lower Back  . Family history of breast cancer     Past Surgical History  Procedure Laterality Date  . Breast reduction surgery    . Breast enhancement surgery  11/2005    saline sub pectorial  . Cesarean section  08/1990  . Augmentation mammaplasty  11/07    Saline    Current Outpatient Prescriptions  Medication Sig Dispense Refill  . ALPRAZolam (XANAX) 0.25 MG tablet Take 1 tablet (0.25 mg total) by mouth at bedtime as needed for anxiety. 30 tablet 0  .  multivitamin (THERAGRAN) per tablet Take 1 tablet by mouth daily.    . Norethindrone-Ethinyl Estradiol-Fe Biphas (LO LOESTRIN FE) 1 MG-10 MCG / 10 MCG tablet Take 1 tablet by mouth daily. 1 Package 3  . valACYclovir (VALTREX) 500 MG tablet Take 1 tablet (500 mg total) by mouth 2 (two) times daily. Take one tablet BID at onset of symptoms for 3 days. 12 tablet 1  . alendronate (FOSAMAX) 70 MG tablet Take 70 mg by mouth once a week. Reported on 06/16/2015  0  . calcium carbonate (OS-CAL) 600 MG TABS tablet Take 600 mg by mouth 2 (two) times daily with a meal. Reported on 06/16/2015    . medroxyPROGESTERone (PROVERA) 10 MG tablet Take 1 tablet (10 mg total) by mouth daily. (Patient not taking: Reported on 06/16/2015) 10 tablet 0   No current facility-administered medications for this visit.    Family History  Problem Relation Age of Onset  . Coronary artery disease Neg Hx   . Breast cancer Mother 90  . Polycythemia Mother   . Heart attack Mother   . Heart attack Father   . Parkinson's disease Father   . Hypertension Father   . Hyperlipidemia Father   . Breast cancer Paternal Grandmother 22  . Cancer Maternal Uncle     NOS  . Bone cancer Maternal  Grandmother 60  . Heart attack Maternal Grandfather   . Heart attack Paternal Grandfather     ROS:  Pertinent items are noted in HPI.  Otherwise, a comprehensive ROS was negative.  Exam:   BP 100/66 mmHg  Pulse 70  Resp 16  Ht 5\' 5"  ([id number] m)  Wt [id number] lb 6.4 oz (57.788 kg)  BMI 21.20 kg/m2    General appearance: alert, cooperative and appears stated age Head: Normocephalic, without obvious abnormality, atraumatic Neck: no adenopathy, supple, symmetrical, trachea midline and thyroid normal to inspection and palpation Lungs: clear to auscultation bilaterally Breasts: No nipple retraction or dimpling, No nipple discharge or bleeding, No axillary or supraclavicular adenopathy, no dominant masses, breast implants present.  Heart: regular rate and  rhythm Abdomen: incisions:  Yes.    Pfannenstiel , soft, non-tender; no masses, no organomegaly Extremities: extremities normal, atraumatic, no cyanosis or edema Skin: Skin color, texture, turgor normal. No rashes or lesions Lymph nodes: Cervical, supraclavicular, and axillary nodes normal. No abnormal inguinal nodes palpated Neurologic: Grossly normal  Pelvic: External genitalia:  no lesions              Urethra:  normal appearing urethra with no masses, tenderness or lesions              Bartholins and Skenes: normal                 Vagina: normal appearing vagina with normal color and discharge, no lesions              Cervix: no lesions              Pap taken:  No. Bimanual Exam:  Uterus:  normal size, contour, position, consistency, mobility, non-tender              Adnexa: normal adnexa and no mass, fullness, tenderness              Rectal exam: Yes.  .  Confirms.              Anus:  normal sphincter tone, no lesions  Chaperone was present for exam.  Assessment:   Well woman visit with normal exam. Amenorrhea.  Probable menopause.  Osteoporosis.  FH of breast cancer.  No genetic testing done after consultation with genetics specialist. Increased lifetime risk of breast cancer. Weight loss.  Plan: Yearly mammogram recommended after age 54. Discussed option for yearly breast MRI. Recommended self breast exam.  Pap and HR HPV as above. Discussed Calcium, Vitamin D, regular exercise program including cardiovascular and weight bearing exercise. Labs performed.  Yes.  .   See orders.  FSH, estradiol, hep C. Prescription medication(s) given.  No.. Patient will return with her bone density and recent lab work with her PCP to discuss her osteoporosis and treatment options further. Follow up annually and prn.       After visit summary provided.

## 2015-06-17 LAB — HEPATITIS C ANTIBODY: HCV AB: NEGATIVE

## 2015-06-17 LAB — ESTRADIOL: ESTRADIOL: 20 pg/mL

## 2015-06-17 LAB — FOLLICLE STIMULATING HORMONE: FSH: 88.5 m[IU]/mL

## 2015-06-20 ENCOUNTER — Telehealth: Payer: Self-pay | Admitting: Obstetrics and Gynecology

## 2015-06-20 NOTE — Telephone Encounter (Signed)
Left patient a message to call back to reschedule a future appointment for a bone density consult that was cancelled by the provider. Patient will call back to reschedule at a later date.

## 2015-06-22 ENCOUNTER — Telehealth: Payer: Self-pay

## 2015-06-22 ENCOUNTER — Ambulatory Visit: Payer: BLUE CROSS/BLUE SHIELD | Admitting: Obstetrics and Gynecology

## 2015-06-22 NOTE — Telephone Encounter (Signed)
Called patient at [phone] to discuss lab results, left message to call me back.  Patient has not reviewed her "MyChart" message.

## 2015-06-22 NOTE — Telephone Encounter (Signed)
-----   Message from Nunzio Cobbs, MD sent at 06/17/2015 12:42 PM EDT ----- Results to patient through My Chart.  "Hi Stefanie Rodriguez,   Your labs are back, and they confirm menopause.   Your hepatitis C testing is negative.   I will see you back for consultation about your new diagnosis of osteoporosis.   Have a great weekend.   Josefa Half, MD" "

## 2015-07-07 NOTE — Telephone Encounter (Signed)
Patient has appointment 07-12-15 to discuss Bone Density results.

## 2015-07-12 ENCOUNTER — Ambulatory Visit (INDEPENDENT_AMBULATORY_CARE_PROVIDER_SITE_OTHER): Payer: BLUE CROSS/BLUE SHIELD | Admitting: Obstetrics and Gynecology

## 2015-07-12 ENCOUNTER — Encounter: Payer: Self-pay | Admitting: Obstetrics and Gynecology

## 2015-07-12 ENCOUNTER — Ambulatory Visit (AMBULATORY_SURGERY_CENTER): Payer: Self-pay | Admitting: *Deleted

## 2015-07-12 VITALS — Ht 65.0 in | Wt 129.0 lb

## 2015-07-12 VITALS — BP 110/66 | HR 70 | Ht 65.0 in | Wt 128.6 lb

## 2015-07-12 DIAGNOSIS — Z1211 Encounter for screening for malignant neoplasm of colon: Secondary | ICD-10-CM

## 2015-07-12 DIAGNOSIS — M810 Age-related osteoporosis without current pathological fracture: Secondary | ICD-10-CM

## 2015-07-12 MED ORDER — NA SULFATE-K SULFATE-MG SULF 17.5-3.13-1.6 GM/177ML PO SOLN: 1.0000 | Freq: Once | ORAL | Status: DC

## 2015-07-12 NOTE — Progress Notes (Signed)
Patient ID: Stefanie Rodriguez, female   DOB: January 05, 1966, 50 y.o.   MRN: [id number] GYNECOLOGY  VISIT   HPI: 50 y.o.   Married  Caucasian  female   G5P3 with No LMP recorded. Patient is not currently having periods (Reason: Perimenopausal).   here for consult to discuss Bone Density results.  BMD report showing: T score of spine: -3.6 T score of left hip: - 2.4 T score of right hip:  -2.7  Rx from PCP for Fosamax. Has not started it yet.   No hx colitis or gastric surgery.  No chronic steroids.  Not a smoker.  No lactose intolerance.  Is minimizing dairy products.   Takes calcium and vit D.   Drinks almond milk   Ca 9.7, TSH 1.22 Cr 0.7  Mother has osteoporosis and had fx of both hips.   GYNECOLOGIC HISTORY: No LMP recorded. Patient is not currently having periods (Reason: Perimenopausal). Contraception:  Postmenopausal Menopausal hormone therapy:  none Last mammogram:  09-22-14 Bil.reduction 1986 and bil.implants 2007--3D/density B/Neg/BiRads1:[facility] Last pap smear:   05-06-14 Neg:Neg HR HPV        OB History    Gravida Para Term Preterm AB TAB SAB Ectopic Multiple Living   _0 Patient Active Problem List   Diagnosis Date Noted  . Family history of breast cancer   . Anxiety state, unspecified 10/16/2012  . Syncope 05/29/2011  . Palpitations 05/29/2011  . Blurred vision 05/29/2011    Past Medical History  Diagnosis Date  . Palpitations   . Syncope and collapse   . Anxiety 11/2004  . Depression 11/2004  . STD (sexually transmitted disease)     Recurrent HSV Lower Back  . Family history of breast cancer     Past Surgical History  Procedure Laterality Date  . Breast reduction surgery    . Breast enhancement surgery  11/2005    saline sub pectorial  . Cesarean section  08/1990  . Augmentation mammaplasty  11/07    Saline    Current Outpatient Prescriptions  Medication Sig Dispense Refill  . alendronate (FOSAMAX) 70 MG tablet  Take 70 mg by mouth once a week. Reported on 07/12/2015  0  . calcium carbonate (OS-CAL) 600 MG TABS tablet Take 600 mg by mouth 2 (two) times daily with a meal. Reported on 06/16/2015    . multivitamin (THERAGRAN) per tablet Take 1 tablet by mouth daily.    . Na Sulfate-K Sulfate-Mg Sulf (SUPREP BOWEL PREP KIT) 17.5-3.13-1.6 GM/180ML SOLN Take 1 kit by mouth once. Name brand only, suprep as directed, no substitutions 354 mL 0  . valACYclovir (VALTREX) 500 MG tablet Take 1 tablet (500 mg total) by mouth 2 (two) times daily. Take one tablet BID at onset of symptoms for 3 days. 12 tablet 1   No current facility-administered medications for this visit.     ALLERGIES: Review of patient's allergies indicates no known allergies.  Family History  Problem Relation Age of Onset  . Coronary artery disease Neg Hx   . Colon cancer Neg Hx   . Breast cancer Mother 53  . Polycythemia Mother   . Heart attack Mother   . Heart attack Father   . Parkinson's disease Father   . Hypertension Father   . Hyperlipidemia Father   . Breast cancer Paternal Grandmother 61  . Cancer Maternal Uncle     NOS  .  Bone cancer Maternal Grandmother 60  . Heart attack Maternal Grandfather   . Heart attack Paternal Grandfather     Social History   Social History  . Marital Status: Married    Spouse Name: N/A  . Number of Children: N/A  . Years of Education: N/A   Occupational History  . Not on file.   Social History Main Topics  . Smoking status: Former Smoker -- 2 years    Types: Cigarettes    Quit date: 01/14/1985  . Smokeless tobacco: Never Used  . Alcohol Use: 1.8 oz/week    3 Standard drinks or equivalent per week     Comment: 3 glasses of wine a week  . Drug Use: No  . Sexual Activity:    Partners: Male    Birth Control/ Protection: None   Other Topics Concern  . Not on file   Social History Narrative    ROS:  Pertinent items are noted in HPI.  PHYSICAL EXAMINATION:    Ht _0  (1.651 m)     General appearance: alert, cooperative and appears stated age   Chaperone was present for exam.  ASSESSMENT  Advanced osteoporosis. Newly menopausal female. FH of breast cancer. No genetic testing done after consultation with genetics specialist. Increased lifetime risk of breast cancer. Weight loss.  PLAN  Discussion of osteoporosis - risk factors, fracture risk. Patient has advanced disease at a young age.  I am recommending a PTH and vitamin D level.  We discussed options for tx including bisphosphonates, Evista, Reclast, Prolia, and Forteo. I am recommending consultation with an endocrinologist for potential further evaluation and tx as patient may need to go directly to a more effective tx for her bone loss such as Reclast, Prolia, or Forteo.  Discussed reducing risk of falls. Follow for annual and prn.    An After Visit Summary was printed and given to the patient.  _25_____ minutes face to face time of which over 50% was spent in counseling.

## 2015-07-12 NOTE — Progress Notes (Signed)
No egg or soy allergy. No anesthesia problems.  No home O2.  No diet meds.  

## 2015-07-13 ENCOUNTER — Other Ambulatory Visit: Payer: Self-pay | Admitting: Obstetrics and Gynecology

## 2015-07-13 DIAGNOSIS — M810 Age-related osteoporosis without current pathological fracture: Secondary | ICD-10-CM

## 2015-07-13 LAB — PARATHYROID HORMONE, INTACT (NO CA): PTH: 31 pg/mL (ref 14–64)

## 2015-07-13 LAB — VITAMIN D 25 HYDROXY (VIT D DEFICIENCY, FRACTURES): Vit D, 25-Hydroxy: 33 ng/mL (ref 30–100)

## 2015-07-18 ENCOUNTER — Encounter: Payer: Self-pay | Admitting: Internal Medicine

## 2015-07-24 ENCOUNTER — Telehealth: Payer: Self-pay | Admitting: Obstetrics and Gynecology

## 2015-07-24 NOTE — Telephone Encounter (Signed)
Left voicemail regarding referral appointment. The information is listed below. Should the patient need to cancel or reschedule this appointment, please advise them to call the office they've been referred to in order to reschedule.  [facility] [address] [place], [place] [address] Phone: [phone]  08/09/15 @ 430 with Dr Chalmers Cater. Please arrive 15 minutes early with insurance card and photo id.

## 2015-07-25 NOTE — Telephone Encounter (Signed)
Patient scheduled with Dr.Balan in the next few weeks, cannot remember exact date. Patient said no need to return her call unless you have questions.

## 2015-08-01 ENCOUNTER — Encounter: Payer: Self-pay | Admitting: Internal Medicine

## 2015-08-01 ENCOUNTER — Ambulatory Visit (AMBULATORY_SURGERY_CENTER): Payer: BLUE CROSS/BLUE SHIELD | Admitting: Internal Medicine

## 2015-08-01 ENCOUNTER — Telehealth: Payer: Self-pay | Admitting: Internal Medicine

## 2015-08-01 VITALS — BP 114/70 | HR 56 | Temp 97.5°F | Resp 17 | Ht 65.0 in | Wt 129.0 lb

## 2015-08-01 DIAGNOSIS — D122 Benign neoplasm of ascending colon: Secondary | ICD-10-CM

## 2015-08-01 DIAGNOSIS — Z1211 Encounter for screening for malignant neoplasm of colon: Secondary | ICD-10-CM

## 2015-08-01 DIAGNOSIS — D125 Benign neoplasm of sigmoid colon: Secondary | ICD-10-CM | POA: Diagnosis not present

## 2015-08-01 MED ORDER — SODIUM CHLORIDE 0.9 % IV SOLN: 500.0000 mL | INTRAVENOUS | Status: DC

## 2015-08-01 NOTE — Op Note (Signed)
[place] Patient Name: Stefanie Rodriguez Procedure Date: 08/01/2015 10:00 AM MRN: [id number] Endoscopist: Docia Chuck. Henrene Pastor , MD Age: 50 Referring MD:  Date of Birth: November 14, 1965 Gender: Female Account #: [id number] Procedure:                Colonoscopy, with cold snare polypectomy x 2 Indications:              Screening for colorectal malignant neoplasm Medicines:                Monitored Anesthesia Care Procedure:                Pre-Anesthesia Assessment:                           - Prior to the procedure, a History and Physical                            was performed, and patient medications and                            allergies were reviewed. The patient's tolerance of                            previous anesthesia was also reviewed. The risks                            and benefits of the procedure and the sedation                            options and risks were discussed with the patient.                            All questions were answered, and informed consent                            was obtained. Prior Anticoagulants: The patient has                            taken no previous anticoagulant or antiplatelet                            agents. ASA Grade Assessment: I - A normal, healthy                            patient. After reviewing the risks and benefits,                            the patient was deemed in satisfactory condition to                            undergo the procedure.                           After obtaining informed consent, the colonoscope  was passed under direct vision. Throughout the                            procedure, the patient's blood pressure, pulse, and                            oxygen saturations were monitored continuously. The                            Model CF-HQ190L [id number]) scope was introduced                            through the anus and advanced to the the cecum,       identified by appendiceal orifice and ileocecal                            valve. The ileocecal valve, appendiceal orifice,                            and rectum were photographed. The quality of the                            bowel preparation was excellent. The colonoscopy                            was performed without difficulty. The patient                            tolerated the procedure well. The bowel preparation                            used was SUPREP. Scope In: 10:13:17 AM Scope Out: 10:31:32 AM Scope Withdrawal Time: 0 hours 14 minutes 50 seconds  Total Procedure Duration: 0 hours 18 minutes 15 seconds  Findings:                 Two polyps were found in the sigmoid colon and                            ascending colon. The polyps were 3 to 6 mm in size.                            These polyps were removed with a cold snare.                            Resection and retrieval were complete.                           The exam was otherwise without abnormality on                            direct and retroflexion views. Complications:            No immediate complications. Estimated blood loss:  None. Estimated Blood Loss:     Estimated blood loss: none. Impression:               - Two 3 to 6 mm polyps in the sigmoid colon and in                            the ascending colon, removed with a cold snare.                            Resected and retrieved.                           - The examination was otherwise normal on direct                            and retroflexion views. Recommendation:           - Repeat colonoscopy in 5 years for surveillance.                           - Patient has a contact number available for                            emergencies. The signs and symptoms of potential                            delayed complications were discussed with the                            patient. Return to normal activities tomorrow.                             Written discharge instructions were provided to the                            patient.                           - Resume previous diet.                           - Continue present medications.                           - Await pathology results. Docia Chuck. Henrene Pastor, MD 08/01/2015 10:35:59 AM This report has been signed electronically. CC Letter to:             [employer]

## 2015-08-01 NOTE — Progress Notes (Signed)
A and O x3. Report to RN. Tolerated MAC anesthesia well. 

## 2015-08-01 NOTE — Progress Notes (Signed)
Called to room to assist during endoscopic procedure.  Patient ID and intended procedure confirmed with present staff. Received instructions for my participation in the procedure from the performing physician.  

## 2015-08-01 NOTE — Patient Instructions (Signed)
YOU HAD AN ENDOSCOPIC PROCEDURE TODAY AT [facility]:   Refer to the procedure report that was given to you for any specific questions about what was found during the examination.  If the procedure report does not answer your questions, please call your gastroenterologist to clarify.  If you requested that your care partner not be given the details of your procedure findings, then the procedure report has been included in a sealed envelope for you to review at your convenience later.  YOU SHOULD EXPECT: Some feelings of bloating in the abdomen. Passage of more gas than usual.  Walking can help get rid of the air that was put into your GI tract during the procedure and reduce the bloating. If you had a lower endoscopy (such as a colonoscopy or flexible sigmoidoscopy) you may notice spotting of blood in your stool or on the toilet paper. If you underwent a bowel prep for your procedure, you may not have a normal bowel movement for a few days.  Please Note:  You might notice some irritation and congestion in your nose or some drainage.  This is from the oxygen used during your procedure.  There is no need for concern and it should clear up in a day or so.  SYMPTOMS TO REPORT IMMEDIATELY:   Following lower endoscopy (colonoscopy or flexible sigmoidoscopy):  Excessive amounts of blood in the stool  Significant tenderness or worsening of abdominal pains  Swelling of the abdomen that is new, acute  Fever of 100F or higher    For urgent or emergent issues, a gastroenterologist can be reached at any hour by calling [phone].   DIET: Your first meal following the procedure should be a small meal and then it is ok to progress to your normal diet. Heavy or fried foods are harder to digest and may make you feel nauseous or bloated.  Likewise, meals heavy in dairy and vegetables can increase bloating.  Drink plenty of fluids but you should avoid alcoholic beverages for 24  hours.  ACTIVITY:  You should plan to take it easy for the rest of today and you should NOT DRIVE or use heavy machinery until tomorrow (because of the sedation medicines used during the test).    FOLLOW UP: Our staff will call the number listed on your records the next business day following your procedure to check on you and address any questions or concerns that you may have regarding the information given to you following your procedure. If we do not reach you, we will leave a message.  However, if you are feeling well and you are not experiencing any problems, there is no need to return our call.  We will assume that you have returned to your regular daily activities without incident.  If any biopsies were taken you will be contacted by phone or by letter within the next 1-3 weeks.  Please call us at [phone] if you have not heard about the biopsies in 3 weeks.    SIGNATURES/CONFIDENTIALITY: You and/or your care partner have signed paperwork which will be entered into your electronic medical record.  These signatures attest to the fact that that the information above on your After Visit Summary has been reviewed and is understood.  Full responsibility of the confidentiality of this discharge information lies with you and/or your care-partner.  Polyp handout provided. Continue present medications.

## 2015-08-01 NOTE — Telephone Encounter (Signed)
Spoke with patient's husband initially, and he stated that the patient had two separate bowel movements within 15 minutes of each other.  Stated that there were were several clots and a lot of blood in the bottom of the bowl.   Then, the patient came on the phone and stated that she had some bleeding with clots.   I told her to go to the emergency room to be checked out.  Then she stated that she was a good friend of Dr. Henrene Pastor, and she'd like to know what hospital he preferred.  I told her to go to [facility] to to it's proximity to her.  She agreed.  She stated that she might call Dr. Henrene Pastor, and see what he thinks.  Dr. Henrene Pastor was paged to this number after speaking with Dr. Loletha Carrow who was the doctor of the day when this call came into the recovery room at 4:50pm.  I spoke with Dr. Henrene Pastor and he will call the patient.

## 2015-08-02 ENCOUNTER — Telehealth: Payer: Self-pay

## 2015-08-02 NOTE — Telephone Encounter (Signed)
Left message on answering machine. 

## 2015-08-03 ENCOUNTER — Encounter: Payer: Self-pay | Admitting: Internal Medicine

## 2015-10-18 ENCOUNTER — Other Ambulatory Visit: Payer: Self-pay | Admitting: Obstetrics and Gynecology

## 2015-10-18 DIAGNOSIS — Z1231 Encounter for screening mammogram for malignant neoplasm of breast: Secondary | ICD-10-CM

## 2015-10-31 ENCOUNTER — Ambulatory Visit
Admission: RE | Admit: 2015-10-31 | Discharge: 2015-10-31 | Disposition: A | Discharge status: Home / Self Care | Payer: BLUE CROSS/BLUE SHIELD | Source: Ambulatory Visit | Attending: Obstetrics and Gynecology | Admitting: Obstetrics and Gynecology

## 2015-10-31 DIAGNOSIS — Z1231 Encounter for screening mammogram for malignant neoplasm of breast: Secondary | ICD-10-CM

## 2015-11-02 ENCOUNTER — Encounter: Payer: Self-pay | Admitting: Certified Nurse Midwife

## 2015-11-02 ENCOUNTER — Ambulatory Visit (INDEPENDENT_AMBULATORY_CARE_PROVIDER_SITE_OTHER): Payer: BLUE CROSS/BLUE SHIELD | Admitting: Certified Nurse Midwife

## 2015-11-02 VITALS — BP 110/62 | HR 70 | Temp 98.1°F | Resp 16 | Ht 65.0 in | Wt 131.0 lb

## 2015-11-02 DIAGNOSIS — R319 Hematuria, unspecified: Secondary | ICD-10-CM

## 2015-11-02 DIAGNOSIS — N390 Urinary tract infection, site not specified: Secondary | ICD-10-CM | POA: Diagnosis not present

## 2015-11-02 LAB — POCT URINALYSIS DIPSTICK
BILIRUBIN UA: NEGATIVE
Glucose, UA: NEGATIVE
Ketones, UA: NEGATIVE
NITRITE UA: NEGATIVE
PH UA: 5
PROTEIN UA: NEGATIVE
Urobilinogen, UA: NEGATIVE

## 2015-11-02 MED ORDER — NITROFURANTOIN MONOHYD MACRO 100 MG PO CAPS: 100.0000 mg | ORAL_CAPSULE | Freq: Two times a day (BID) | ORAL | 0 refills | Status: DC

## 2015-11-02 MED ORDER — PHENAZOPYRIDINE HCL 200 MG PO TABS: 200.0000 mg | ORAL_TABLET | Freq: Three times a day (TID) | ORAL | 0 refills | Status: DC | PRN

## 2015-11-02 NOTE — Progress Notes (Signed)
50 y.o. Married Caucasian female G5P3 here with complaint of UTI, with onset  on in last 24 hours.. Patient complaining of urinary frequency/urgency/ and pain with urination. Patient denies fever, chills, nausea or back pain. No new personal products. Patient feels not related to sexual activity. Denies any vaginal symptoms.  Menopausal with vaginal dryness. Patient is consuming adequate water intake. No other health issues.   O: Healthy female WDWN Affect: Normal, orientation x 3 Skin : warm and dry CVAT: negative bilateral Abdomen: positive for suprapubic tenderness  Pelvic exam: External genital area: normal, no lesions Bladder,Urethra tender, Urethral meatus: tender, red Vagina: normal vaginal discharge, normal appearance  Vaginal dryness noted around introitus Cervix: normal, non tender Uterus:normal,non tender Adnexa: normal non tender, no fullness or masses   A: UTI with hematuria Normal pelvic exam poct urine-wbc 1+, rbc 2+ Vaginal dryness noted  P: Reviewed findings of UTI and need for treatment. [id number] see order with instructions Rx Pyridium see order [id number] micro, culture Reviewed warning signs and symptoms of UTI and need to advise if occurring. Encouraged to limit soda, tea, and coffee and be sure to increase water intake. Discussed vaginal dryness noted around entrance to vagina, which can increase risk of UTI. Discussed OTC coconut oil use for moisture and protection. Patient uses for her hands and happy to use as directed once UTI cleared.   RV prn

## 2015-11-02 NOTE — Patient Instructions (Signed)

## 2015-11-03 LAB — URINALYSIS, MICROSCOPIC ONLY
CASTS: NONE SEEN [LPF]
CRYSTALS: NONE SEEN [HPF]
Squamous Epithelial / LPF: NONE SEEN [HPF] (ref <=5)
YEAST: NONE SEEN [HPF]

## 2015-11-04 LAB — URINE CULTURE: Colony Count: >=100000

## 2015-11-10 NOTE — Progress Notes (Signed)
Encounter reviewed Jill Jertson, MD   

## 2016-06-19 ENCOUNTER — Ambulatory Visit: Payer: BLUE CROSS/BLUE SHIELD | Admitting: Obstetrics and Gynecology

## 2016-08-19 ENCOUNTER — Ambulatory Visit: Payer: BLUE CROSS/BLUE SHIELD | Admitting: Obstetrics and Gynecology

## 2016-11-11 ENCOUNTER — Ambulatory Visit: Payer: BLUE CROSS/BLUE SHIELD | Admitting: Obstetrics and Gynecology

## 2017-07-28 ENCOUNTER — Other Ambulatory Visit: Payer: Self-pay | Admitting: Obstetrics and Gynecology

## 2017-07-28 DIAGNOSIS — Z1231 Encounter for screening mammogram for malignant neoplasm of breast: Secondary | ICD-10-CM

## 2017-08-15 ENCOUNTER — Ambulatory Visit
Admission: RE | Admit: 2017-08-15 | Discharge: 2017-08-15 | Disposition: A | Discharge status: Home / Self Care | Payer: BLUE CROSS/BLUE SHIELD | Source: Ambulatory Visit | Attending: Obstetrics and Gynecology | Admitting: Obstetrics and Gynecology

## 2017-08-15 DIAGNOSIS — Z1231 Encounter for screening mammogram for malignant neoplasm of breast: Secondary | ICD-10-CM

## 2017-11-19 NOTE — Progress Notes (Addendum)
52 y.o. G37P3 Married Caucasian female here for annual exam.    Patient complaining of dyspareunia due to vaginal dryness.  No vaginal bleeding.  Decreased sex drive.  Painful intercourse - burning.  Uses coconut oil which is somewhat helpful.  Interested in genetic testing.   Would like her breast implants removed.  Her surgeon is Dr. Stephanie Coup.  Started Wellbutrin for anxiety and stress.  Feels good and no side effects.   PCP:  Velna Hatchet, MD   Patient's last menstrual period was 04/15/2014 (approximate).           Sexually active: Yes.    female The current method of family planning is post menopausal status.    Exercising: Yes.    walking, yoga and weights Smoker:  no  Health Maintenance: Pap: 05-06-14 Neg:Neg HR HPV, 04-01-12 Neg:Neg HR HPV History of abnormal Pap:  no MMG: 08-15-17 Bil.Implants/Neg/density B/BiRads1 Colonoscopy: 08-01-15 polyps;next due 2022 BMD: 04-18-15  Result :Osteoporosis--followed by Dr.Holwerda--getting ready to start Prolia.  BMD just done in September at [facility] office.  TDaP: 02/2008 Gardasil:   no HIV:Neg in pregnancy Hep C: 06-16-15 Neg Screening Labs:    PCP. Flu vaccine:  Done.    reports that she quit smoking about 32 years ago. Her smoking use included cigarettes. She quit after 2.00 years of use. She has never used smokeless tobacco. She reports that she drank alcohol. She reports that she does not use drugs.  Past Medical History:  Diagnosis Date  . Anxiety 11/2004  . Depression 11/2004  . Family history of breast cancer   . Osteoporosis    followed by Dr. Ardeth Perfect  . Palpitations   . STD (sexually transmitted disease)    Recurrent HSV Lower Back  . Syncope and collapse     Past Surgical History:  Procedure Laterality Date  . AUGMENTATION MAMMAPLASTY  11/07   Saline  . BREAST ENHANCEMENT SURGERY  11/2005   saline sub pectorial  . BREAST REDUCTION SURGERY    . CESAREAN SECTION  08/1990    Current Outpatient Medications   Medication Sig Dispense Refill  . buPROPion (WELLBUTRIN SR) 150 MG 12 hr tablet Take 1 tablet by mouth daily.  3  . calcium carbonate (OS-CAL) 600 MG TABS tablet Take 600 mg by mouth 2 (two) times daily with a meal. Reported on 06/16/2015    . Cholecalciferol (VITAMIN D3) 1.25 MG (50000 UT) CAPS Take 1 tablet by mouth once a week.  3  . fluticasone (FLONASE) 50 MCG/ACT nasal spray Place 1 spray into both nostrils as needed.    . multivitamin (THERAGRAN) per tablet Take 1 tablet by mouth daily.    . valACYclovir (VALTREX) 500 MG tablet Take 1 tablet (500 mg total) by mouth 2 (two) times daily. Take one tablet BID at onset of symptoms for 3 days. 12 tablet 1   No current facility-administered medications for this visit.     Family History  Problem Relation Age of Onset  . Breast cancer Mother 41  . Polycythemia Mother   . Heart attack Mother   . Heart attack Father   . Parkinson's disease Father   . Hypertension Father   . Hyperlipidemia Father   . Breast cancer Paternal Grandmother 40  . Cancer Maternal Uncle        NOS  . Bone cancer Maternal Grandmother 60  . Heart attack Maternal Grandfather   . Heart attack Paternal Grandfather   . Coronary artery disease Neg Hx   .  Colon cancer Neg Hx     Review of Systems  All other systems reviewed and are negative.   Exam:   BP 100/60 (BP Location: Right Arm, Patient Position: Sitting, Cuff Size: Normal)   Pulse 70   Resp 16   Ht 5\' 5"  (1.651 m)   Wt 144 lb 6.4 oz (65.5 kg)   LMP 04/15/2014 (Approximate)   BMI 24.03 kg/m     General appearance: alert, cooperative and appears stated age Head: Normocephalic, without obvious abnormality, atraumatic Neck: no adenopathy, supple, symmetrical, trachea midline and thyroid normal to inspection and palpation Lungs: clear to auscultation bilaterally Breasts:  Bilateral implants and scars consistent with reduction, no masses or tenderness, No nipple retraction or dimpling, No nipple  discharge or bleeding, No axillary or supraclavicular adenopathy Heart: regular rate and rhythm Abdomen: soft, non-tender; no masses, no organomegaly Extremities: extremities normal, atraumatic, no cyanosis or edema Skin: Skin color, texture, turgor normal. No rashes or lesions Lymph nodes: Cervical, supraclavicular, and axillary nodes normal. No abnormal inguinal nodes palpated Neurologic: Grossly normal  Pelvic: External genitalia:  no lesions              Urethra:  normal appearing urethra with no masses, tenderness or lesions              Bartholins and Skenes: normal                 Vagina: normal appearing vagina with normal color and discharge, no lesions              Cervix: no lesions              Pap taken: Yes.   Bimanual Exam:  Uterus:  normal size, contour, position, consistency, mobility, non-tender              Adnexa: no mass, fullness, tenderness              Rectal exam: Yes.  .  Confirms.              Anus:  normal sphincter tone, no lesions  Chaperone was present for exam.  Assessment:   Well woman visit with normal exam. Osteoporosis.  Starting Prolia.  PCP managing.  FH of breast cancer.  No genetic testing done after consultation with genetics specialist. Increased lifetime risk of breast cancer. Bilateral breast implants. Vaginal atrophy. Hx HSV.   Plan: Mammogram screening. Recommended self breast awareness. Pap and HR HPV as above. Guidelines for Calcium, Vitamin D, regular exercise program including cardiovascular and weight bearing exercise. Referral back to genetics for counseling and testing.  Premarin Premarin vaginal cream for vaginal atrophy.  I discussed potential risk of breast cancer.  Coupon given. Refill of Valtrex.  She may choose to follow up with Dr. Stephanie Coup. Follow up annually and prn.   After visit summary provided.

## 2017-11-20 ENCOUNTER — Ambulatory Visit: Payer: BLUE CROSS/BLUE SHIELD | Admitting: Obstetrics and Gynecology

## 2017-11-20 ENCOUNTER — Other Ambulatory Visit (HOSPITAL_COMMUNITY)
Admission: RE | Admit: 2017-11-20 | Discharge: 2017-11-20 | Disposition: A | Discharge status: Home / Self Care | Payer: BLUE CROSS/BLUE SHIELD | Source: Ambulatory Visit | Attending: Obstetrics and Gynecology | Admitting: Obstetrics and Gynecology

## 2017-11-20 ENCOUNTER — Other Ambulatory Visit: Payer: Self-pay

## 2017-11-20 ENCOUNTER — Encounter: Payer: Self-pay | Admitting: Obstetrics and Gynecology

## 2017-11-20 VITALS — BP 100/60 | HR 70 | Resp 16 | Ht 65.0 in | Wt 144.4 lb

## 2017-11-20 DIAGNOSIS — B009 Herpesviral infection, unspecified: Secondary | ICD-10-CM | POA: Diagnosis not present

## 2017-11-20 DIAGNOSIS — Z803 Family history of malignant neoplasm of breast: Secondary | ICD-10-CM

## 2017-11-20 DIAGNOSIS — Z01419 Encounter for gynecological examination (general) (routine) without abnormal findings: Secondary | ICD-10-CM | POA: Diagnosis present

## 2017-11-20 MED ORDER — VALACYCLOVIR HCL 500 MG PO TABS: 500.0000 mg | ORAL_TABLET | Freq: Two times a day (BID) | ORAL | 1 refills | Status: AC

## 2017-11-20 MED ORDER — ESTROGENS, CONJUGATED 0.625 MG/GM VA CREA: TOPICAL_CREAM | VAGINAL | 2 refills | Status: DC

## 2017-11-20 NOTE — Patient Instructions (Signed)

## 2017-11-25 LAB — CYTOLOGY - PAP
DIAGNOSIS: NEGATIVE
HPV (WINDOPATH): NOT DETECTED

## 2018-03-16 ENCOUNTER — Other Ambulatory Visit: Payer: Self-pay | Admitting: Obstetrics and Gynecology

## 2018-03-16 NOTE — Telephone Encounter (Signed)
Please contact patient back about the request for Xanax.  I believe she is currently taking Wellbutrin for anxiety.  Her provider who is prescribing this should probably be the one to give her an Rx for Xanax.

## 2018-03-16 NOTE — Telephone Encounter (Signed)
Dr. Quincy Simmonds -please advise on Xanax 0.25mg  PO tab for travel. Rx pended.   Last RX sent 04/20/14 #30/0RF  Last AEX 11/20/17

## 2018-03-16 NOTE — Telephone Encounter (Signed)
Patient is calling regarding a refill request for Xanax 0.25MG . Patient stated that she has spoken with Dr. Quincy Simmonds previously regarding anxiety  With traveling. Patient is leaving Saturday for 10 days of travel.

## 2018-03-16 NOTE — Telephone Encounter (Signed)
Spoke with patient, advised as seen below per Dr. Quincy Simmonds. Patient verbalizes understanding and is agreeable. Thankful for return call.   Routing to provider for final review. Patient is agreeable to disposition. Will close encounter.

## 2018-08-14 ENCOUNTER — Other Ambulatory Visit: Payer: Self-pay | Admitting: Obstetrics and Gynecology

## 2018-08-14 ENCOUNTER — Other Ambulatory Visit: Payer: Self-pay | Admitting: Internal Medicine

## 2018-08-14 ENCOUNTER — Telehealth: Payer: Self-pay | Admitting: Obstetrics and Gynecology

## 2018-08-14 DIAGNOSIS — Z1231 Encounter for screening mammogram for malignant neoplasm of breast: Secondary | ICD-10-CM

## 2018-08-14 NOTE — Telephone Encounter (Signed)
Patient requesting appointment to discuss HRT. She would really like a Web visit. She is having vaginal dryness and some hot flashes. She states she never really used the Estrogen cream prescribed in 11/2017. Patient is scheduled for AEX 11-27-18. Made Web visit for 08-26-18 4:30pm with Dr.Silva.

## 2018-08-14 NOTE — Telephone Encounter (Signed)
Patient would like to discuss HRT with Dr. Quincy Simmonds. States she has the cream, but has not started it yet and would like to discuss the patch. If possible, could this be a virtual appointment?

## 2018-08-26 ENCOUNTER — Other Ambulatory Visit: Payer: Self-pay

## 2018-08-26 ENCOUNTER — Telehealth (INDEPENDENT_AMBULATORY_CARE_PROVIDER_SITE_OTHER): Payer: BC Managed Care – PPO | Admitting: Obstetrics and Gynecology

## 2018-08-26 DIAGNOSIS — N952 Postmenopausal atrophic vaginitis: Secondary | ICD-10-CM

## 2018-08-26 DIAGNOSIS — N951 Menopausal and female climacteric states: Secondary | ICD-10-CM | POA: Diagnosis not present

## 2018-08-26 MED ORDER — ESTRADIOL 2 MG VA RING: 2.0000 mg | VAGINAL_RING | VAGINAL | 0 refills | Status: DC

## 2018-08-26 NOTE — Progress Notes (Signed)
GYNECOLOGY  VISIT   HPI: 52 y.o.   Married  Caucasian  female   G5P3 with Patient's last menstrual period was 04/15/2014 (approximate).   here for My chart virtual visit.   She is in her bedroom.  I am in the office.  She gives permission for the consultation.   Started at 4:50.    Finished at 5:09.  Hot flashes are much better.  Mood swings are stable.  She is having vaginal dryness, which is her biggest concern.   She is not using the cream due to messiness.  She is not sleeping well.  She is using melatonin, which is helpful.  She is asking about the patch, which a friend is using.   She is concerned about breast cancer.   [place] 88.5 06/16/15.  GYNECOLOGIC HISTORY: Patient's last menstrual period was 04/15/2014 (approximate). Contraception:  NA Menopausal hormone therapy: NA Last mammogram:  09/28/18 Last pap smear:   11/20/17 - Normal, neg HR HPV.         OB History    Gravida  5   Para  3   Term      Preterm      AB      Living  3     SAB      TAB      Ectopic      Multiple      Live Births                 Patient Active Problem List   Diagnosis Date Noted  . Family history of breast cancer   . Anxiety state, unspecified 10/16/2012  . Syncope 05/29/2011  . Palpitations 05/29/2011  . Blurred vision 05/29/2011    Past Medical History:  Diagnosis Date  . Anxiety 11/2004  . Depression 11/2004  . Family history of breast cancer   . Osteoporosis    followed by Dr. Ardeth Perfect  . Palpitations   . STD (sexually transmitted disease)    Recurrent HSV Lower Back  . Syncope and collapse     Past Surgical History:  Procedure Laterality Date  . AUGMENTATION MAMMAPLASTY  11/07   Saline  . BREAST ENHANCEMENT SURGERY  11/2005   saline sub pectorial  . BREAST REDUCTION SURGERY    . CESAREAN SECTION  08/1990    Current Outpatient Medications  Medication Sig Dispense Refill  . buPROPion (WELLBUTRIN SR) 150 MG 12 hr tablet Take 1 tablet by mouth  daily.  3  . calcium carbonate (OS-CAL) 600 MG TABS tablet Take 600 mg by mouth 2 (two) times daily with a meal. Reported on 06/16/2015    . Cholecalciferol (VITAMIN D3) 1.25 MG (50000 UT) CAPS Take 1 tablet by mouth once a week.  3  . conjugated estrogens (PREMARIN) vaginal cream Use 1/2 g vaginally every night at bed time for the first 2 weeks, then use 1/2 g vaginally two or three times per week as needed. 30 g 2  . estradiol (ESTRING) 2 MG vaginal ring Place 2 mg vaginally every 3 (three) months. Insert a new ring into vagina every 3 months 1 each 0  . fluticasone (FLONASE) 50 MCG/ACT nasal spray Place 1 spray into both nostrils as needed.    . multivitamin (THERAGRAN) per tablet Take 1 tablet by mouth daily.    . valACYclovir (VALTREX) 500 MG tablet Take 1 tablet (500 mg total) by mouth 2 (two) times daily. Take one tablet BID at onset of symptoms for 3 days.  30 tablet 1   No current facility-administered medications for this visit.      ALLERGIES: Patient has no known allergies.  Family History  Problem Relation Age of Onset  . Breast cancer Mother 75  . Polycythemia Mother   . Heart attack Mother   . Heart attack Father   . Parkinson's disease Father   . Hypertension Father   . Hyperlipidemia Father   . Breast cancer Paternal Grandmother 76  . Cancer Maternal Uncle        NOS  . Bone cancer Maternal Grandmother 60  . Heart attack Maternal Grandfather   . Heart attack Paternal Grandfather   . Coronary artery disease Neg Hx   . Colon cancer Neg Hx     Social History   Socioeconomic History  . Marital status: Married    Spouse name: Not on file  . Number of children: Not on file  . Years of education: Not on file  . Highest education level: Not on file  Occupational History  . Not on file  Social Needs  . Financial resource strain: Not on file  . Food insecurity    Worry: Not on file    Inability: Not on file  . Transportation needs    Medical: Not on file     Non-medical: Not on file  Tobacco Use  . Smoking status: Former Smoker    Years: 2.00    Types: Cigarettes    Quit date: 01/14/1985    Years since quitting: 33.6  . Smokeless tobacco: Never Used  Substance and Sexual Activity  . Alcohol use: Not Currently    Comment: 3 glasses of wine a week  . Drug use: No  . Sexual activity: Yes    Partners: Male    Birth control/protection: None, Post-menopausal  Lifestyle  . Physical activity    Days per week: Not on file    Minutes per session: Not on file  . Stress: Not on file  Relationships  . Social Herbalist on phone: Not on file    Gets together: Not on file    Attends religious service: Not on file    Active member of club or organization: Not on file    Attends meetings of clubs or organizations: Not on file    Relationship status: Not on file  . Intimate partner violence    Fear of current or ex partner: Not on file    Emotionally abused: Not on file    Physically abused: Not on file    Forced sexual activity: Not on file  Other Topics Concern  . Not on file  Social History Narrative  . Not on file    Review of Systems  See HPI.   PHYSICAL EXAMINATION:    LMP 04/15/2014 (Approximate)     General appearance: alert, cooperative and appears stated age  ASSESSMENT  Menopausal symptoms.  Vaginal atrophy. FH breast cancer.   PLAN  We reviewed the [facility], and I do not recommend ERT due to increased risk of stroke, DVT, and PE. We discussed nonhormonal treatments for vasomotor symptoms, and she declines this.  Vit E and coconut oil discussed.  She will start Estring. Instructed in use.  I discussed potential effect on breast cancer.  She will let me know if this is working for her so I can continue her prescription or make a change in her care.    An After Visit Summary was printed and given to  the patient.  __19____ minutes face to face time of which over 50% was spent in counseling.

## 2018-08-28 ENCOUNTER — Encounter: Payer: Self-pay | Admitting: Obstetrics and Gynecology

## 2018-09-15 DIAGNOSIS — E079 Disorder of thyroid, unspecified: Secondary | ICD-10-CM

## 2018-09-15 HISTORY — DX: Disorder of thyroid, unspecified: E07.9

## 2018-09-28 ENCOUNTER — Ambulatory Visit: Payer: BLUE CROSS/BLUE SHIELD

## 2018-10-01 ENCOUNTER — Other Ambulatory Visit: Payer: Self-pay | Admitting: Internal Medicine

## 2018-10-01 DIAGNOSIS — E041 Nontoxic single thyroid nodule: Secondary | ICD-10-CM

## 2018-10-14 ENCOUNTER — Ambulatory Visit
Admission: RE | Admit: 2018-10-14 | Discharge: 2018-10-14 | Disposition: A | Discharge status: Home / Self Care | Payer: BC Managed Care – PPO | Source: Ambulatory Visit | Attending: Internal Medicine | Admitting: Internal Medicine

## 2018-10-14 DIAGNOSIS — E041 Nontoxic single thyroid nodule: Secondary | ICD-10-CM

## 2018-10-20 ENCOUNTER — Other Ambulatory Visit: Payer: Self-pay | Admitting: Internal Medicine

## 2018-10-20 DIAGNOSIS — E041 Nontoxic single thyroid nodule: Secondary | ICD-10-CM

## 2018-11-09 ENCOUNTER — Other Ambulatory Visit: Payer: Self-pay

## 2018-11-09 ENCOUNTER — Ambulatory Visit
Admission: RE | Admit: 2018-11-09 | Discharge: 2018-11-09 | Disposition: A | Discharge status: Home / Self Care | Payer: BC Managed Care – PPO | Source: Ambulatory Visit | Attending: Obstetrics and Gynecology | Admitting: Obstetrics and Gynecology

## 2018-11-09 DIAGNOSIS — Z1231 Encounter for screening mammogram for malignant neoplasm of breast: Secondary | ICD-10-CM

## 2018-11-11 ENCOUNTER — Other Ambulatory Visit (HOSPITAL_COMMUNITY)
Admission: RE | Admit: 2018-11-11 | Discharge: 2018-11-11 | Disposition: A | Discharge status: Home / Self Care | Payer: BC Managed Care – PPO | Source: Ambulatory Visit | Attending: Radiology | Admitting: Radiology

## 2018-11-11 ENCOUNTER — Ambulatory Visit
Admission: RE | Admit: 2018-11-11 | Discharge: 2018-11-11 | Disposition: A | Discharge status: Home / Self Care | Payer: BC Managed Care – PPO | Source: Ambulatory Visit | Attending: Internal Medicine | Admitting: Internal Medicine

## 2018-11-11 DIAGNOSIS — E041 Nontoxic single thyroid nodule: Secondary | ICD-10-CM

## 2018-11-12 LAB — CYTOLOGY - NON PAP

## 2018-11-27 ENCOUNTER — Ambulatory Visit: Payer: BLUE CROSS/BLUE SHIELD | Admitting: Obstetrics and Gynecology

## 2018-11-27 ENCOUNTER — Other Ambulatory Visit: Payer: Self-pay

## 2018-11-30 NOTE — Progress Notes (Signed)
53 y.o. G48P3 Married Caucasian female here for annual exam.    Diagnosed with thyroid nodules by CT scan 09/2018--biopsy inconclusive. She has appointment to see Dr.Gerkin for further evaluation.  She started Estring after her video chat visit in August. She felt anxious and she decided to remove it after an hour.  Coconut oil is helping with dryness.   Has a hot flash every now and then.  Has a new rash on her arms.  Saw dermatology.   Daughter had Covid.   PCP: Velna Hatchet, MD    Patient's last menstrual period was 04/15/2014 (approximate).          Sexually active: Yes.    The current method of family planning is post menopausal status.    Exercising: Yes.    walks 46miles/day and light weights Smoker:  no  Health Maintenance: Pap:11-20-17 Neg:Neg HR HPV, 05-06-14 Neg:Neg HR HPV, 04-01-12 Neg:Neg HR HPV History of abnormal Pap:  no MMG: 11-09-18 3D/Implants/Neg/density B/Birads1  Colonoscopy: 2017 polyps;next 2022 BMD: 2019  Result :Osteoporosis - PCP managing.  TDaP: with PCP Gardasil:   no HIV: Neg in preg Hep C:06-16-15 Neg Screening Labs:  PCP.  Flu vaccine:  Completed.   reports that she quit smoking about 33 years ago. Her smoking use included cigarettes. She quit after 2.00 years of use. She has never used smokeless tobacco. She reports previous alcohol use of about 2.0 standard drinks of alcohol per week. She reports that she does not use drugs.  Past Medical History:  Diagnosis Date  . Anxiety 11/2004  . Depression 11/2004  . Family history of breast cancer   . Osteoporosis    followed by Dr. Ardeth Perfect  . Palpitations   . STD (sexually transmitted disease)    Recurrent HSV Lower Back  . Syncope and collapse   . Thyroid disease 09/2018   thyroid nodules--bx inconclusive--has appt.with Dr.Gerkin    Past Surgical History:  Procedure Laterality Date  . AUGMENTATION MAMMAPLASTY  11/07   Saline  . BREAST ENHANCEMENT SURGERY  11/2005   saline sub pectorial   . BREAST REDUCTION SURGERY    . CESAREAN SECTION  08/1990    Current Outpatient Medications  Medication Sig Dispense Refill  . buPROPion (WELLBUTRIN SR) 150 MG 12 hr tablet Take 1 tablet by mouth daily.  3  . calcium carbonate (OS-CAL) 600 MG TABS tablet Take 600 mg by mouth 2 (two) times daily with a meal. Reported on 06/16/2015    . Cholecalciferol (VITAMIN D3) 1.25 MG (50000 UT) CAPS Take 1 tablet by mouth once a week.  3  . fluocinonide cream (LIDEX) [id number] % Apply 1 application topically 2 (two) times daily.    . fluticasone (FLONASE) 50 MCG/ACT nasal spray Place 1 spray into both nostrils as needed.    . multivitamin (THERAGRAN) per tablet Take 1 tablet by mouth daily.    . valACYclovir (VALTREX) 500 MG tablet Take 1 tablet (500 mg total) by mouth 2 (two) times daily. Take one tablet BID at onset of symptoms for 3 days. 30 tablet 1   No current facility-administered medications for this visit.     Family History  Problem Relation Age of Onset  . Breast cancer Mother 45  . Polycythemia Mother   . Heart attack Mother   . Heart attack Father   . Parkinson's disease Father   . Hypertension Father   . Hyperlipidemia Father   . Breast cancer Paternal Grandmother 67  . Cancer Maternal Uncle  NOS  . Bone cancer Maternal Grandmother 60  . Heart attack Maternal Grandfather   . Heart attack Paternal Grandfather   . Coronary artery disease Neg Hx   . Colon cancer Neg Hx     Review of Systems  All other systems reviewed and are negative.   Exam:   BP 110/68   Pulse 60   Temp (!) 97.2 F (36.2 C) (Temporal)   Resp 16   Ht 5\' 5"  (1.651 m)   Wt 137 lb 6.4 oz (62.3 kg)   LMP 04/15/2014 (Approximate)   BMI 22.86 kg/m     General appearance: alert, cooperative and appears stated age Head: normocephalic, without obvious abnormality, atraumatic Neck: no adenopathy, supple, symmetrical, trachea midline and thyroid normal to inspection and palpation Lungs: clear to  auscultation bilaterally Breasts: bilateral implants, no masses or tenderness, No nipple retraction or dimpling, No nipple discharge or bleeding, No axillary adenopathy Heart: regular rate and rhythm Abdomen: soft, non-tender; no masses, no organomegaly Extremities: extremities normal, atraumatic, no cyanosis or edema Skin: skin color, texture, turgor normal. No rashes or lesions Lymph nodes: cervical, supraclavicular, and axillary nodes normal. Neurologic: grossly normal  Pelvic: External genitalia:  no lesions              No abnormal inguinal nodes palpated.              Urethra:  normal appearing urethra with no masses, tenderness or lesions              Bartholins and Skenes: normal                 Vagina: normal appearing vagina with normal color and discharge, no lesions              Cervix: no lesions              Pap taken: No. Bimanual Exam:  Uterus:  normal size, contour, position, consistency, mobility, non-tender              Adnexa: no mass, fullness, tenderness              Rectal exam: Yes.  .  Confirms.              Anus:  normal sphincter tone, no lesions  Chaperone was present for exam.  Assessment:   Well woman visit with normal exam. Osteoporosis.  PCP managing.  Low vit D.  On replacement.  FH of breast cancer. No genetic testing done after consultation with genetics specialist. Increased lifetime risk of breast cancer. Bilateral breast implants. Vaginal atrophy.  Using coconut oil.  Hx HSV.   Plan: Mammogram screening discussed. Will proceed with breast MRI.  Self breast awareness reviewed. Pap and HR HPV in 2024. Guidelines for Calcium, Vitamin D, regular exercise program including cardiovascular and weight bearing exercise. Declines refill of Valtrex. Follow up annually and prn.   After visit summary provided.

## 2018-12-01 ENCOUNTER — Encounter: Payer: Self-pay | Admitting: Obstetrics and Gynecology

## 2018-12-01 ENCOUNTER — Ambulatory Visit: Payer: BC Managed Care – PPO | Admitting: Obstetrics and Gynecology

## 2018-12-01 ENCOUNTER — Other Ambulatory Visit: Payer: Self-pay

## 2018-12-01 VITALS — BP 110/68 | HR 60 | Temp 97.2°F | Resp 16 | Ht 65.0 in | Wt 137.4 lb

## 2018-12-01 DIAGNOSIS — Z9189 Other specified personal risk factors, not elsewhere classified: Secondary | ICD-10-CM | POA: Diagnosis not present

## 2018-12-01 DIAGNOSIS — Z01419 Encounter for gynecological examination (general) (routine) without abnormal findings: Secondary | ICD-10-CM | POA: Diagnosis not present

## 2018-12-01 NOTE — Patient Instructions (Signed)

## 2018-12-25 ENCOUNTER — Other Ambulatory Visit: Payer: Self-pay | Admitting: Physician Assistant

## 2018-12-26 ENCOUNTER — Other Ambulatory Visit: Payer: Self-pay

## 2018-12-26 ENCOUNTER — Ambulatory Visit
Admission: RE | Admit: 2018-12-26 | Discharge: 2018-12-26 | Disposition: A | Discharge status: Home / Self Care | Payer: BC Managed Care – PPO | Source: Ambulatory Visit | Attending: Obstetrics and Gynecology | Admitting: Obstetrics and Gynecology

## 2018-12-26 DIAGNOSIS — Z9189 Other specified personal risk factors, not elsewhere classified: Secondary | ICD-10-CM

## 2018-12-26 MED ORDER — GADOBUTROL 1 MMOL/ML IV SOLN
6.0000 mL | Freq: Once | INTRAVENOUS | Status: AC | PRN
  Administered 2018-12-26: 6 mL via INTRAVENOUS

## 2019-01-26 ENCOUNTER — Ambulatory Visit: Payer: BC Managed Care – PPO | Admitting: Obstetrics and Gynecology

## 2019-01-28 ENCOUNTER — Other Ambulatory Visit: Payer: Self-pay | Admitting: Surgery

## 2019-01-28 DIAGNOSIS — E041 Nontoxic single thyroid nodule: Secondary | ICD-10-CM

## 2019-02-11 ENCOUNTER — Ambulatory Visit
Admission: RE | Admit: 2019-02-11 | Discharge: 2019-02-11 | Disposition: A | Discharge status: Home / Self Care | Payer: BC Managed Care – PPO | Source: Ambulatory Visit | Attending: Surgery | Admitting: Surgery

## 2019-02-11 ENCOUNTER — Other Ambulatory Visit (HOSPITAL_COMMUNITY)
Admission: RE | Admit: 2019-02-11 | Discharge: 2019-02-11 | Disposition: A | Discharge status: Home / Self Care | Payer: BC Managed Care – PPO | Source: Ambulatory Visit | Attending: Surgery | Admitting: Surgery

## 2019-02-11 DIAGNOSIS — E041 Nontoxic single thyroid nodule: Secondary | ICD-10-CM

## 2019-02-12 LAB — CYTOLOGY - NON PAP

## 2019-11-25 ENCOUNTER — Other Ambulatory Visit: Payer: Self-pay | Admitting: Internal Medicine

## 2019-11-25 DIAGNOSIS — M810 Age-related osteoporosis without current pathological fracture: Secondary | ICD-10-CM

## 2019-11-25 DIAGNOSIS — E559 Vitamin D deficiency, unspecified: Secondary | ICD-10-CM

## 2019-11-29 ENCOUNTER — Other Ambulatory Visit: Payer: Self-pay | Admitting: Internal Medicine

## 2019-11-29 ENCOUNTER — Other Ambulatory Visit: Payer: Self-pay | Admitting: Surgery

## 2019-11-29 DIAGNOSIS — Z1231 Encounter for screening mammogram for malignant neoplasm of breast: Secondary | ICD-10-CM

## 2019-11-29 DIAGNOSIS — E041 Nontoxic single thyroid nodule: Secondary | ICD-10-CM

## 2019-12-02 ENCOUNTER — Ambulatory Visit: Payer: BC Managed Care – PPO | Admitting: Obstetrics and Gynecology

## 2019-12-28 IMAGING — MR MR BREAST BILAT WO/W CM
8 of 12 series · 33 of 48 positions shown · IV contrast (6 ML GADAVIST)
Comparison: Previous exam(s).

CLINICAL DATA: 53-year-old female presenting for high risk
screening MRI. Strong family history of breast cancer. Remote
bilateral reduction and augmentation mammoplasty.

LABS:  None performed on site.
EXAM:
BILATERAL BREAST MRI WITH AND WITHOUT CONTRAST
TECHNIQUE: Multiplanar, multisequence MR images of both breasts were obtained
prior to and following the intravenous administration of 6 ml of
Gadavist 1

[Series 2: t2_tirm_tra ipat (a-p) · axial · 3.0mm · 0.70mm/px · 1 of 55 slices shown]
[im 1/55]
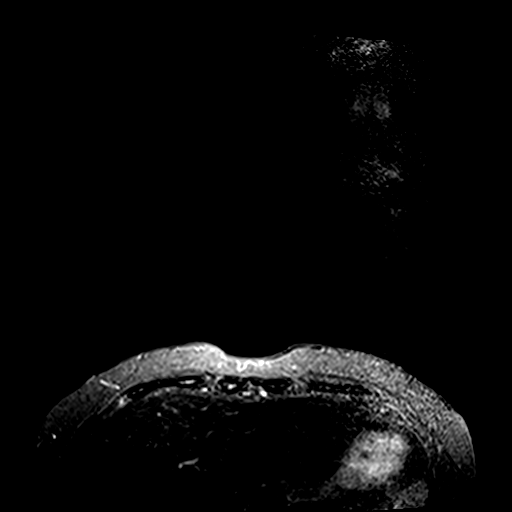

[Series 3: fl3d pre-cm no · axial · non-contrast · 1.2mm · 0.94mm/px · z∈[-78,+93]mm · 5 of 144 slices shown]
[im 1/144]
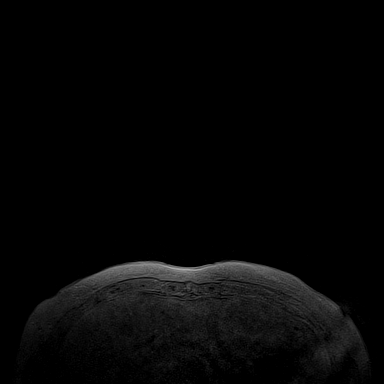
[im 36/144]
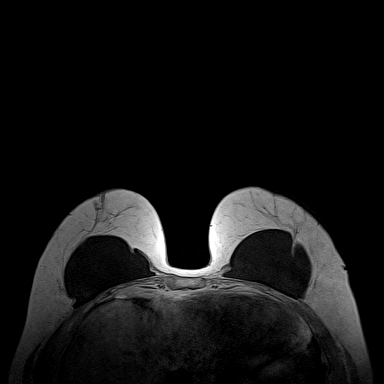
[im 72/144]
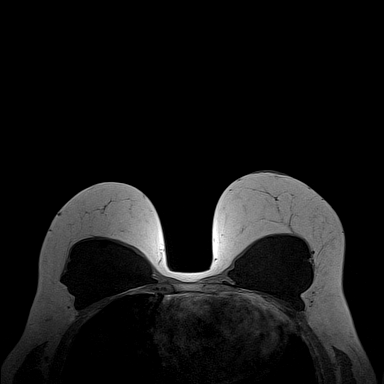
[im 108/144]
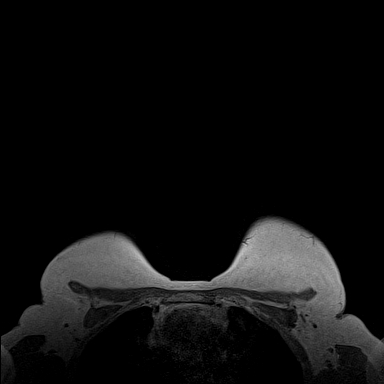
[im 144/144]
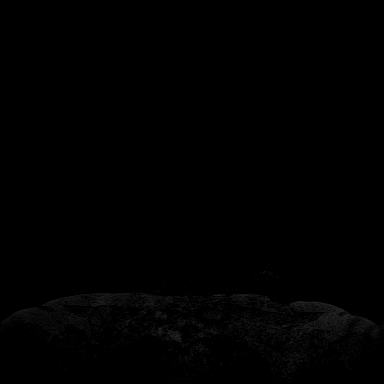

[Series 4: fl3d pre-cm · axial · non-contrast · 1.2mm · 0.94mm/px · z∈[-78,+93]mm · 5 of 144 slices shown]
[im 1/144]
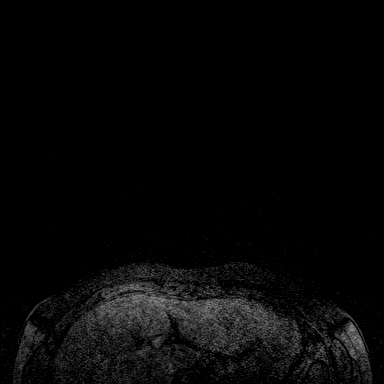
[im 36/144]
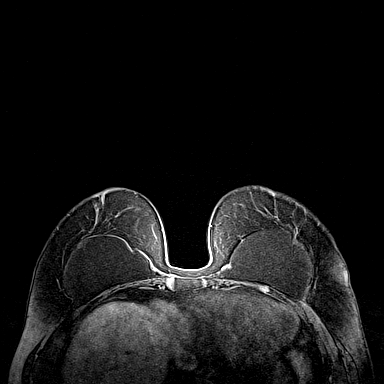
[im 72/144]
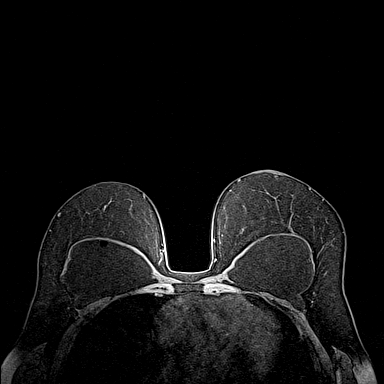
[im 108/144]
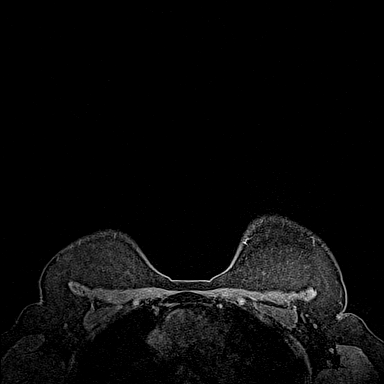
[im 144/144]
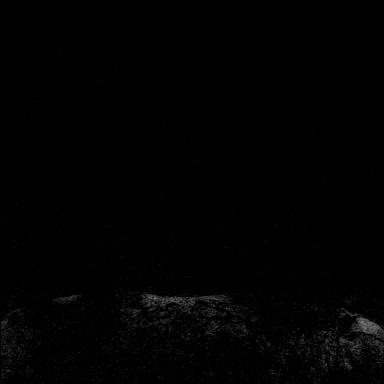

[Series 5: fl3d post-cm 20 · axial · 1.2mm · 0.94mm/px · z∈[-78,+93]mm · 5 of 144 slices shown (1 of 3)]
[im 1/144]
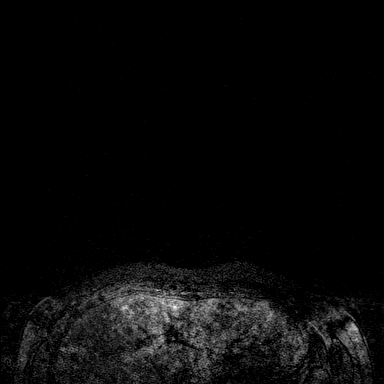
[im 36/144]
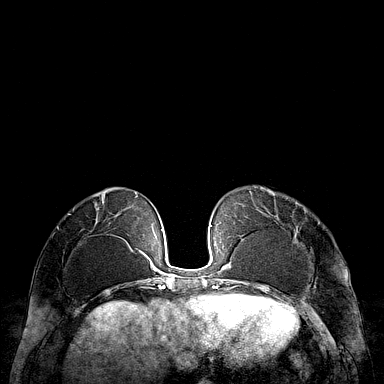
[im 72/144]
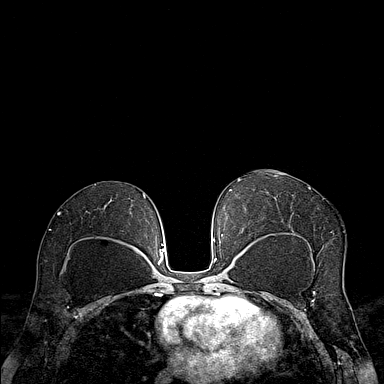
[im 108/144]
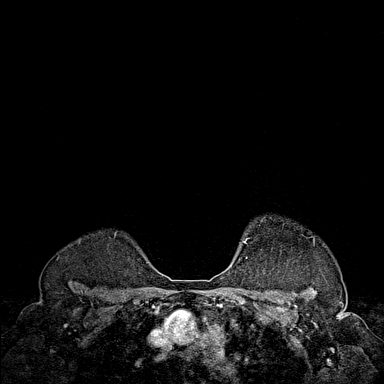
[im 144/144]
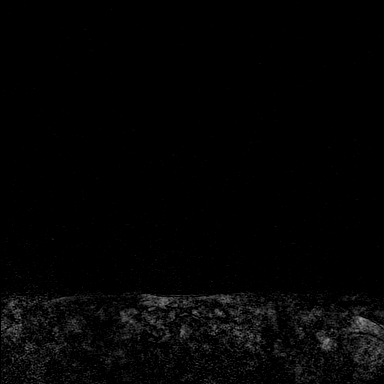

[Series 6: fl3d post-cm 20 · axial · 1.2mm · 0.94mm/px · z∈[-78,+93]mm · 5 of 144 slices shown (2 of 3)]
[im 1/144]
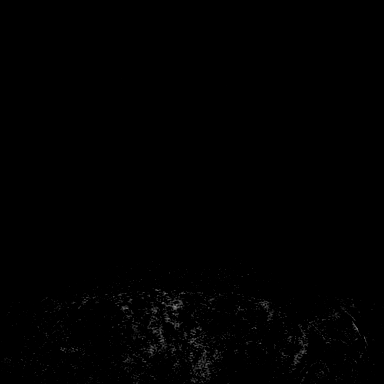
[im 36/144]
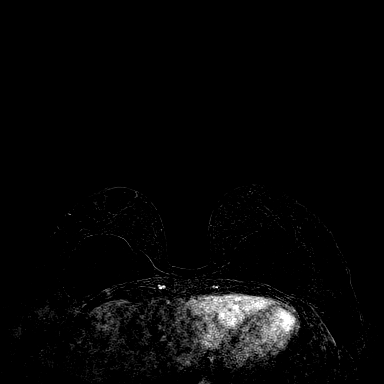
[im 72/144]
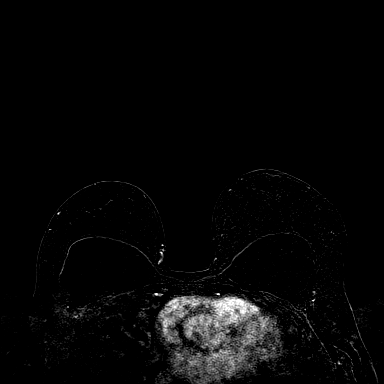
[im 108/144]
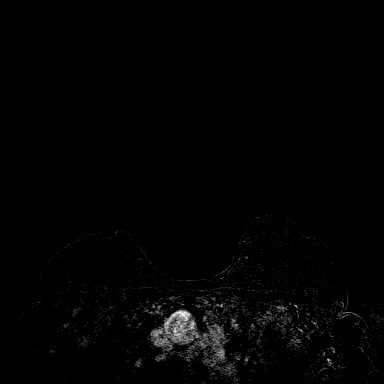
[im 144/144]
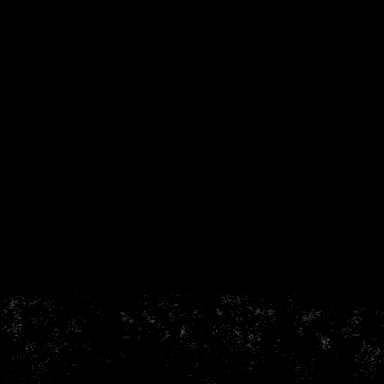

[Series 7: fl3d post-cm 20 · axial · 172.8mm · 0.94mm/px · 1 of 1 slices shown (3 of 3)]
[im 1/1]
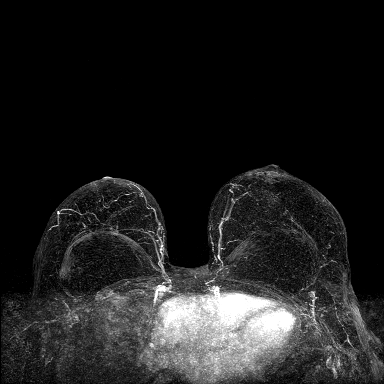

[Series 8: fl3d post-cm 3min · axial · 1.2mm · 0.94mm/px · z∈[-78,+93]mm · 6 of 144 slices shown]
[im 1/144]
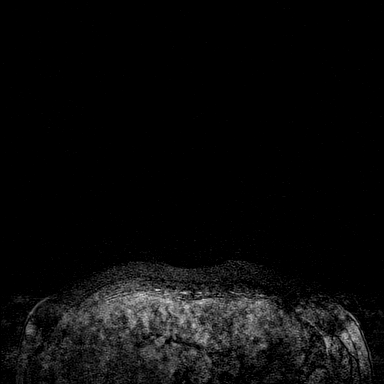
[im 29/144]
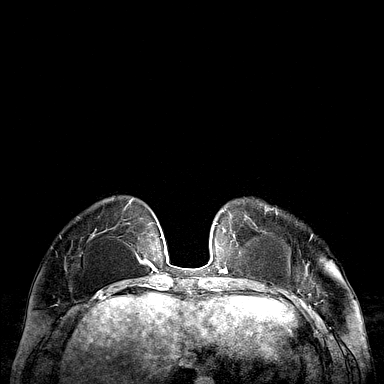
[im 58/144]
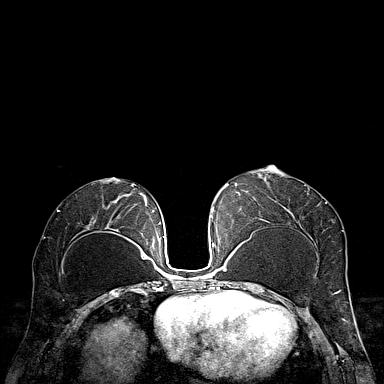
[im 86/144]
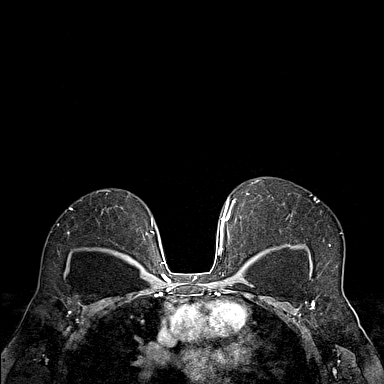
[im 115/144]
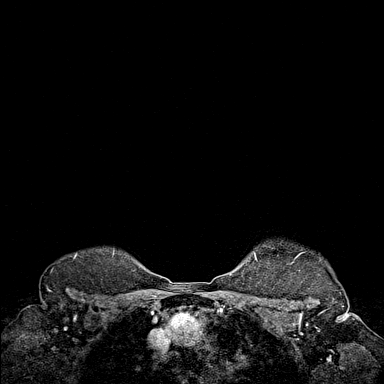
[im 144/144]
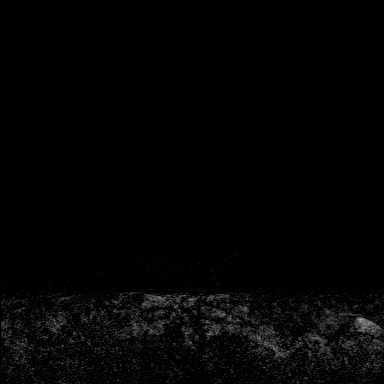

[Series 9: fl3d post-cm 3min_sub · axial · 1.2mm · 0.94mm/px · z∈[-78,+58]mm · 5 of 144 slices shown]
[im 1/144]
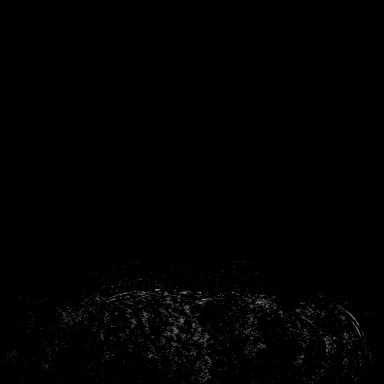
[im 29/144]
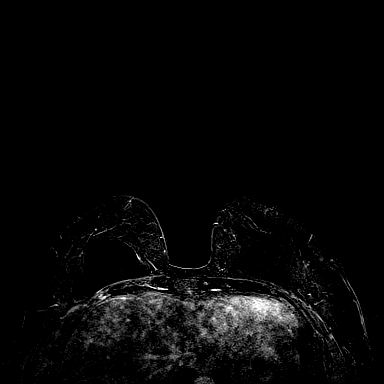
[im 58/144]
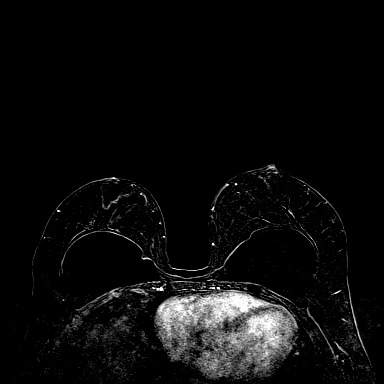
[im 86/144]
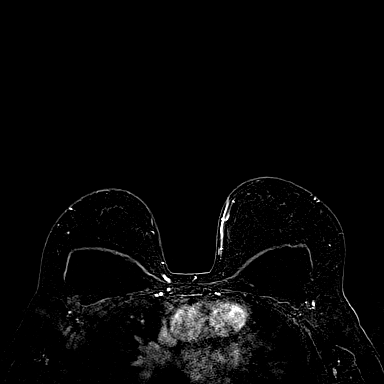
[im 115/144]
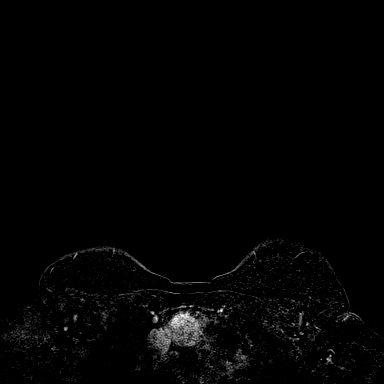

[33 of 48 positions shown; findings below may reference images not displayed]

Three-dimensional MR images were rendered by post-processing of the
original MR data on an independent workstation. The
three-dimensional MR images were interpreted, and findings are
reported in the following complete MRI report for this study. Three
dimensional images were evaluated at the independent [employer]
workstation
FINDINGS: Breast composition: a. Almost entirely fat. The patient has
bilateral retropectoral implants in place.

Background parenchymal enhancement: Mild

Right breast: No mass or abnormal enhancement.

Left breast: No mass or abnormal enhancement.

Lymph nodes: No abnormal appearing lymph nodes.

Ancillary findings:  None.
IMPRESSION: No MRI evidence of malignancy in either breast.

RECOMMENDATION:
Routine annual screening with mammography and breast MRI. The
patient is due for her next screening mammogram in October 2019.

BI-RADS CATEGORY  1: Negative.

## 2020-01-20 ENCOUNTER — Ambulatory Visit
Admission: RE | Admit: 2020-01-20 | Discharge: 2020-01-20 | Disposition: A | Discharge status: Home / Self Care | Payer: BC Managed Care – PPO | Source: Ambulatory Visit | Attending: Internal Medicine | Admitting: Internal Medicine

## 2020-01-20 ENCOUNTER — Other Ambulatory Visit: Payer: Self-pay

## 2020-01-20 DIAGNOSIS — Z1231 Encounter for screening mammogram for malignant neoplasm of breast: Secondary | ICD-10-CM

## 2020-03-13 ENCOUNTER — Other Ambulatory Visit: Payer: BC Managed Care – PPO

## 2020-05-01 ENCOUNTER — Ambulatory Visit: Payer: Self-pay | Admitting: Obstetrics and Gynecology

## 2020-07-13 ENCOUNTER — Ambulatory Visit: Payer: Self-pay | Admitting: Obstetrics and Gynecology

## 2020-07-20 ENCOUNTER — Encounter: Payer: Self-pay | Admitting: Internal Medicine

## 2020-08-10 ENCOUNTER — Other Ambulatory Visit: Payer: Self-pay

## 2020-08-10 ENCOUNTER — Ambulatory Visit
Admission: RE | Admit: 2020-08-10 | Discharge: 2020-08-10 | Disposition: A | Discharge status: Home / Self Care | Payer: BC Managed Care – PPO | Source: Ambulatory Visit | Attending: Internal Medicine | Admitting: Internal Medicine

## 2020-08-10 DIAGNOSIS — E559 Vitamin D deficiency, unspecified: Secondary | ICD-10-CM

## 2020-08-10 DIAGNOSIS — M810 Age-related osteoporosis without current pathological fracture: Secondary | ICD-10-CM
